# Patient Record
Sex: Female | Born: 1970 | Race: Black or African American | Hispanic: No | Marital: Married | Smoking: Never smoker
Health system: Southern US, Community
[De-identification: ages and names within clinical notes are randomized; demographics above are authoritative.]

## PROBLEM LIST (undated history)

## (undated) DIAGNOSIS — I10 Essential (primary) hypertension: Secondary | ICD-10-CM

## (undated) DIAGNOSIS — M1990 Unspecified osteoarthritis, unspecified site: Secondary | ICD-10-CM

## (undated) HISTORY — DX: Unspecified osteoarthritis, unspecified site: M19.90

## (undated) HISTORY — DX: Essential (primary) hypertension: I10

---

## 1997-12-21 ENCOUNTER — Other Ambulatory Visit: Admission: RE | Admit: 1997-12-21 | Discharge: 1997-12-21 | Payer: Self-pay | Admitting: Obstetrics

## 1998-09-22 ENCOUNTER — Encounter: Payer: Self-pay | Admitting: *Deleted

## 1998-09-22 ENCOUNTER — Ambulatory Visit (HOSPITAL_COMMUNITY): Admission: RE | Admit: 1998-09-22 | Discharge: 1998-09-22 | Payer: Self-pay | Admitting: *Deleted

## 1999-02-13 ENCOUNTER — Other Ambulatory Visit: Admission: RE | Admit: 1999-02-13 | Discharge: 1999-02-13 | Payer: Self-pay | Admitting: Obstetrics

## 1999-04-25 ENCOUNTER — Inpatient Hospital Stay (HOSPITAL_COMMUNITY): Admission: AD | Admit: 1999-04-25 | Discharge: 1999-04-25 | Payer: Self-pay | Admitting: Obstetrics

## 1999-05-01 ENCOUNTER — Inpatient Hospital Stay (HOSPITAL_COMMUNITY): Admission: AD | Admit: 1999-05-01 | Discharge: 1999-05-01 | Payer: Self-pay | Admitting: Obstetrics

## 1999-06-11 ENCOUNTER — Inpatient Hospital Stay (HOSPITAL_COMMUNITY): Admission: AD | Admit: 1999-06-11 | Discharge: 1999-06-11 | Payer: Self-pay | Admitting: Obstetrics

## 1999-07-13 ENCOUNTER — Inpatient Hospital Stay (HOSPITAL_COMMUNITY): Admission: AD | Admit: 1999-07-13 | Discharge: 1999-07-13 | Payer: Self-pay | Admitting: Obstetrics

## 1999-08-01 ENCOUNTER — Inpatient Hospital Stay (HOSPITAL_COMMUNITY): Admission: AD | Admit: 1999-08-01 | Discharge: 1999-08-01 | Payer: Self-pay | Admitting: Obstetrics

## 1999-08-02 ENCOUNTER — Inpatient Hospital Stay (HOSPITAL_COMMUNITY): Admission: AD | Admit: 1999-08-02 | Discharge: 1999-08-05 | Payer: Self-pay | Admitting: Obstetrics

## 2002-01-08 HISTORY — PX: KNEE SURGERY: SHX244

## 2002-06-15 ENCOUNTER — Encounter: Payer: Self-pay | Admitting: Family Medicine

## 2002-06-15 ENCOUNTER — Encounter: Admission: RE | Admit: 2002-06-15 | Discharge: 2002-06-15 | Payer: Self-pay | Admitting: Family Medicine

## 2002-07-28 ENCOUNTER — Emergency Department (HOSPITAL_COMMUNITY): Admission: EM | Admit: 2002-07-28 | Discharge: 2002-07-28 | Payer: Self-pay | Admitting: Emergency Medicine

## 2002-08-02 ENCOUNTER — Ambulatory Visit (HOSPITAL_COMMUNITY): Admission: RE | Admit: 2002-08-02 | Discharge: 2002-08-02 | Payer: Self-pay | Admitting: Family Medicine

## 2002-08-02 ENCOUNTER — Encounter: Payer: Self-pay | Admitting: Family Medicine

## 2002-10-08 ENCOUNTER — Ambulatory Visit (HOSPITAL_BASED_OUTPATIENT_CLINIC_OR_DEPARTMENT_OTHER): Admission: RE | Admit: 2002-10-08 | Discharge: 2002-10-08 | Payer: Self-pay | Admitting: Orthopedic Surgery

## 2002-10-16 ENCOUNTER — Encounter: Admission: RE | Admit: 2002-10-16 | Discharge: 2002-11-19 | Payer: Self-pay | Admitting: Orthopedic Surgery

## 2006-02-15 ENCOUNTER — Emergency Department (HOSPITAL_COMMUNITY): Admission: EM | Admit: 2006-02-15 | Discharge: 2006-02-15 | Payer: Self-pay | Admitting: Family Medicine

## 2006-10-24 ENCOUNTER — Ambulatory Visit (HOSPITAL_COMMUNITY): Admission: RE | Admit: 2006-10-24 | Discharge: 2006-10-24 | Payer: Self-pay | Admitting: Obstetrics

## 2007-10-27 ENCOUNTER — Ambulatory Visit (HOSPITAL_COMMUNITY): Admission: RE | Admit: 2007-10-27 | Discharge: 2007-10-27 | Payer: Self-pay | Admitting: Family Medicine

## 2008-09-14 ENCOUNTER — Observation Stay (HOSPITAL_COMMUNITY): Admission: EM | Admit: 2008-09-14 | Discharge: 2008-09-14 | Payer: Self-pay | Admitting: Emergency Medicine

## 2008-09-14 ENCOUNTER — Ambulatory Visit: Payer: Self-pay | Admitting: Cardiovascular Disease

## 2008-09-15 ENCOUNTER — Encounter (INDEPENDENT_AMBULATORY_CARE_PROVIDER_SITE_OTHER): Payer: Self-pay | Admitting: Emergency Medicine

## 2009-03-29 ENCOUNTER — Ambulatory Visit (HOSPITAL_COMMUNITY): Admission: RE | Admit: 2009-03-29 | Discharge: 2009-03-29 | Payer: Self-pay | Admitting: Family Medicine

## 2009-03-29 ENCOUNTER — Other Ambulatory Visit: Admission: RE | Admit: 2009-03-29 | Discharge: 2009-03-29 | Payer: Self-pay | Admitting: Obstetrics and Gynecology

## 2009-04-06 ENCOUNTER — Encounter: Admission: RE | Admit: 2009-04-06 | Discharge: 2009-04-06 | Payer: Self-pay | Admitting: Family Medicine

## 2010-01-29 ENCOUNTER — Encounter: Payer: Self-pay | Admitting: Family Medicine

## 2010-04-04 ENCOUNTER — Other Ambulatory Visit: Payer: Self-pay | Admitting: Obstetrics and Gynecology

## 2010-04-04 ENCOUNTER — Other Ambulatory Visit (HOSPITAL_COMMUNITY)
Admission: RE | Admit: 2010-04-04 | Discharge: 2010-04-04 | Disposition: A | Discharge status: Home / Self Care | Payer: BC Managed Care – PPO | Source: Ambulatory Visit | Attending: Obstetrics and Gynecology | Admitting: Obstetrics and Gynecology

## 2010-04-04 DIAGNOSIS — Z01419 Encounter for gynecological examination (general) (routine) without abnormal findings: Secondary | ICD-10-CM | POA: Insufficient documentation

## 2010-04-14 LAB — CK TOTAL AND CKMB (NOT AT ARMC)
CK, MB: 1.7 ng/mL (ref 0.3–4.0)
CK, MB: 1.9 ng/mL (ref 0.3–4.0)
Total CK: 172 U/L (ref 7–177)

## 2010-04-14 LAB — CBC
Hemoglobin: 12.9 g/dL (ref 12.0–15.0)
MCHC: 33.9 g/dL (ref 30.0–36.0)
RBC: 4.33 MIL/uL (ref 3.87–5.11)
WBC: 4.4 10*3/uL (ref 4.0–10.5)

## 2010-04-14 LAB — DIFFERENTIAL
Basophils Relative: 1 % (ref 0–1)
Eosinophils Absolute: 0.1 10*3/uL (ref 0.0–0.7)
Eosinophils Relative: 3 % (ref 0–5)
Lymphocytes Relative: 33 % (ref 12–46)
Lymphs Abs: 1.4 10*3/uL (ref 0.7–4.0)
Monocytes Absolute: 0.6 10*3/uL (ref 0.1–1.0)
Monocytes Relative: 13 % — ABNORMAL HIGH (ref 3–12)
Neutro Abs: 2.2 10*3/uL (ref 1.7–7.7)
Neutrophils Relative %: 50 % (ref 43–77)

## 2010-04-14 LAB — BASIC METABOLIC PANEL
CO2: 24 mEq/L (ref 19–32)
Glucose, Bld: 94 mg/dL (ref 70–99)
Potassium: 4 mEq/L (ref 3.5–5.1)

## 2010-04-14 LAB — POCT CARDIAC MARKERS
CKMB, poc: <1 ng/mL — ABNORMAL LOW (ref 1.0–8.0)
Troponin i, poc: <0.05 ng/mL (ref 0.00–0.09)

## 2010-04-14 LAB — PROTIME-INR
INR: 1 (ref 0.00–1.49)
Prothrombin Time: 12.6 seconds (ref 11.6–15.2)

## 2010-04-14 LAB — GLUCOSE, CAPILLARY: Glucose-Capillary: 97 mg/dL (ref 70–99)

## 2010-04-14 LAB — TROPONIN I: Troponin I: 0.01 ng/mL (ref 0.00–0.06)

## 2010-05-26 NOTE — Op Note (Signed)
Lisa Small, Lisa Small                      ACCOUNT NO.:  [id number]   MEDICAL RECORD NO.:  [id number]                   PATIENT TYPE:  AMB   LOCATION:  DSC                                  FACILITY:  [facility]   PHYSICIAN:  Rodney A. Chaney Malling, M.D.           DATE OF BIRTH:  1970-07-30   DATE OF PROCEDURE:  10/08/2002  DATE OF DISCHARGE:                                 OPERATIVE REPORT   PREOPERATIVE DIAGNOSES:  Unstable osteochondral fracture, medial femoral  condyle and osteoarthritis medial femoral condyle, right knee.   POSTOPERATIVE DIAGNOSES:  Unstable osteochondral fracture, medial femoral  condyle and osteoarthritis medial femoral condyle, right knee.   OPERATION PERFORMED:  Arthroscopy, removal of unstable osteochondral  fragment from the medial femoral condyle, debridement medial femoral condyle  crater and multiple drilling of crater.   SURGEON:  Lenard Galloway. Chaney Malling, M.D.   ANESTHESIA:  MAC.   PATHOLOGY:  With the arthroscope in the knee, a very careful examination of  the right knee was undertaken.  The patellofemoral junction appeared fairly  normal as did the articular cartilage.  In the lateral compartment, there  was normal articular cartilage over the lateral femoral condyle and lateral  tibial plateau.  The entire circumference of the lateral meniscus was  normal.  Anterior cruciate ligament was normal.  There was a small loose  body adjacent to the anterior cruciate ligament.  In the medial compartment,  the medial meniscus was fairly normal as was the articular cartilage over  the medial tibial plateau.  There was a huge area on the medial side of the  weightbearing area in the medial femoral condyle where there was a large,  loose osteochondral fragment and fraying and tearing of cartilage around  this with a large crater.  This loose fragment would be teased loose from  the crater with a probe and was held on by a tether.   DESCRIPTION OF PROCEDURE:  The  patient was placed on the operating table in  the supine position with a pneumatic tourniquet on the right upper thigh.  The right leg was placed in a leg holder and the right lower extremity was  prepped with DuraPrep and draped out in the usual manner.  Marcaine was  placed in the knee and Xylocaine and epinephrine used to infiltrate the  puncture wounds.  Then an infusion cannula was placed in the superior medial  pouch and the knee distended with saline.  Anteromedial and anterolateral  portals were made and the arthroscope was introduced.  The findings were as  described above.  There was a small loose body just adjacent to the anterior  cruciate ligament and this was easily removed.  The arthroscope was passed  deeper into the medial compartment.  There was a large tethered unstable  osteochondral fragment.  This was teased loose with a probed, grabbed with  pituitary and removed as a single large entity.  The intra-articular shaver  was then introduced and there was a very large crater in the weightbearing  area which was debrided.  There was an underlying raw bone with no articular  cartilage on the bone.  It was elected to do multiple drillings of the base  of the crater using a fracture pick technique.  Multiple holes were drilled.  The infusion cannula was stopped and there was good bleeding from the holes.  Further clean up was then done with the intra-articular shaver.  No other  significant pathology was seen in the knee.  The  knee was then filled with Marcaine and a large bulky compressive dressing  was applied.  Technically this procedure went extremely well.   FOLLOW UP:  See me in the office on Wednesday.                                                  Rodney A. Chaney Malling, M.D.    RAM/MEDQ  D:  10/08/2002  T:  10/08/2002  Job:  [id number]   cc:   Molly Maduro A. Nicholos Johns, M.D.  [address]  [place]  [place] [address]  Fax: [phone]

## 2010-05-26 NOTE — Discharge Summary (Signed)
[facility]  Patient:    Lisa Small, Lisa Small                   MRN: [id number] Adm. Date:  [id number] Disc. Date: [id number] Attending:  Venita Sheffield                           Discharge Summary  HISTORY OF PRESENT ILLNESS:   The patient is a 40 year old gravida 3, para 0-0-2-0 with EDC of August 21, 1999. She was admitted with ruptured membranes and the patient was in good labor all day.  HOSPITAL COURSE:              The patient underwent a primary low transverse cesarean section with delivery of a 6 pound 14 ounce female with Apgars of 9 and 9 from the OP position and nuchal cord.  She underwent the cesarean section because of failure to progress in labor.  Her hemoglobin was 13 on admission and postoperatively 10.8.  She did well and was discharged home on the third postoperative day ambulatory and on a regular diet.  DISCHARGE MEDICATIONS:        Tylenol No. 3 one q.4h. p.r.n.  FOLLOW-UP:                    She is to see me in six weeks.  DISCHARGE DIAGNOSIS:          Status post primary low transverse cesarean section at term for cephalopelvic disproportion. DD:  08/24/99 TD:  08/25/99 Job: [id number]

## 2010-05-26 NOTE — Op Note (Signed)
[facility]  Patient:    Lisa Small, Lisa Small                   MRN: [id number] Proc. Date: 08/03/99 Adm. Date:  [id number] Disc. Date: [id number] Attending:  Venita Sheffield                           Operative Report  PREOPERATIVE DIAGNOSIS:       Failure to progress in labor.  POSTOPERATIVE DIAGNOSIS:      Failure to progress in labor.  OPERATION:  SURGEON:                      Kathreen Cosier, M.D.  ANESTHESIA:                   Epidural.  DESCRIPTION OF PROCEDURE:     The patient was placed on the operating table in the supine position.  The abdomen was prepped and draped.  The bladder emptied with a Foley catheter.  A transverse suprapubic incision made and carried down to the rectus fascia.  The fascia ________ of the incision.  The recti muscles were retracted laterally.  The peritoneum was incised longitudinally. A transverse incision was made in the visceroperitoneum above the bladder and the bladder mobilized inferiorly.  A transverse lower uterine incision was made and the patient delivered from the OP position of a female, Apgar 9 and 9, weighing 6 pounds and 14 ounces.  There is a nuchal cord which was loose.  The fluid was cleared and the team was attendance.  The placenta removed manually. The uterine cavity cleaned with dry laps.  The uterine incision closed in one layer including myometrium and endometrium with #1 chromic.  Hemostasis was satisfactory.  The bladder flap was reattached with 2-0 chromic.  The uterus _______.  The tubes and ovaries are normal.  The abdomen closed in layers, peritoneum with continuous 2-0 chromic, fascia continuous 2-0 Dexon, and the skin closed with subcuticular suture of 3-0 Monocryl.                                Blood loss was 400 cc.  The patient tolerated the procedure well and went to the recovery room in good condition. DD:  08/03/99 TD:  08/05/99 Job: [id number]

## 2010-05-26 NOTE — H&P (Signed)
[facility]  Patient:    Lisa Small, Lisa Small                   MRN: [id number] Adm. Date:  [id number] Disc. Date: [id number] Attending:  Venita Sheffield                         History and Physical  HISTORY OF PRESENT ILLNESS:   The patient is a 40 year old gravida 3 para 0-0-2-0, [facility] August 21, 1999, who is admitted on July 25 with premature rupture of membranes at 5:30 a.m. today.  The fluid was clear.  She was contracting every five minutes.  The cervix was 3 cm, 80%, with the vertex at -2 station.  Her group B strep was negative.  The patient remained in labor throughout the day and at 6:50 p.m. was started on Pitocin stimulation, and by 11 p.m. the cervix was still 4 cm with the vertex -2 to -3 and molded.  There was no change in her cervix from admission at 8:50 a.m.  It was decided she would be delivered by C section for failure to progress.  PHYSICAL EXAMINATION:  GENERAL:                      Revealed a well-developed female in labor.  HEENT:                        Negative.  LUNGS:                        Clear.  HEART:                        Regular rhythm, no murmurs, no gallops.  ABDOMEN:                      Term-sized uterus with an estimated fetal weight at 7 pounds.  BREASTS:                      No masses.  EXTREMITIES:                  Negative. DD:  08/03/99 TD:  08/03/99 Job: [id number]

## 2010-10-05 ENCOUNTER — Other Ambulatory Visit: Payer: Self-pay | Admitting: Family Medicine

## 2010-10-05 DIAGNOSIS — Z1231 Encounter for screening mammogram for malignant neoplasm of breast: Secondary | ICD-10-CM

## 2010-10-23 ENCOUNTER — Ambulatory Visit: Payer: BC Managed Care – PPO

## 2011-10-31 ENCOUNTER — Other Ambulatory Visit (HOSPITAL_COMMUNITY): Payer: Self-pay | Admitting: Internal Medicine

## 2011-10-31 DIAGNOSIS — Z1231 Encounter for screening mammogram for malignant neoplasm of breast: Secondary | ICD-10-CM

## 2011-11-20 ENCOUNTER — Ambulatory Visit (HOSPITAL_COMMUNITY)
Admission: RE | Admit: 2011-11-20 | Discharge: 2011-11-20 | Disposition: A | Discharge status: Home / Self Care | Payer: No Typology Code available for payment source | Source: Ambulatory Visit | Attending: Internal Medicine | Admitting: Internal Medicine

## 2011-11-20 DIAGNOSIS — Z1231 Encounter for screening mammogram for malignant neoplasm of breast: Secondary | ICD-10-CM | POA: Insufficient documentation

## 2012-09-10 ENCOUNTER — Ambulatory Visit (INDEPENDENT_AMBULATORY_CARE_PROVIDER_SITE_OTHER): Payer: BC Managed Care – PPO | Admitting: Family Medicine

## 2012-09-10 VITALS — BP 142/80 | HR 74 | Temp 98.2°F | Resp 20 | Ht 63.0 in | Wt 242.4 lb

## 2012-09-10 DIAGNOSIS — M1711 Unilateral primary osteoarthritis, right knee: Secondary | ICD-10-CM

## 2012-09-10 DIAGNOSIS — M1710 Unilateral primary osteoarthritis, unspecified knee: Secondary | ICD-10-CM

## 2012-09-10 DIAGNOSIS — I10 Essential (primary) hypertension: Secondary | ICD-10-CM

## 2012-09-10 DIAGNOSIS — IMO0002 Reserved for concepts with insufficient information to code with codable children: Secondary | ICD-10-CM

## 2012-09-10 MED ORDER — NABUMETONE 750 MG PO TABS: 750.0000 mg | ORAL_TABLET | Freq: Two times a day (BID) | ORAL | Status: DC | PRN

## 2012-09-10 MED ORDER — HYDROCODONE-ACETAMINOPHEN 5-325 MG PO TABS: 1.0000 | ORAL_TABLET | Freq: Three times a day (TID) | ORAL | Status: DC | PRN

## 2012-09-10 NOTE — Progress Notes (Signed)
Subjective:    Patient ID: Lisa Small, female    DOB: February 05, 1970, 42 y.o.   MRN: [id number] Chief Complaint  Patient presents with  . Knee Pain    Rt Knee X 2 WEEKS - NKI-    HPI  Ms. Brooke Dare does have chronic knee problems for many years. Was diagnosed with early OA 10 yrs ago at the age of 27! Has been seen at [facility] ortho for knee prob in the past but currently won't see her since she owes them $$.  Has been mainly told that she just needs to loose weight which she has been working very hard to try to do - did loose about 50 lbs last yr but then gained some back after her mother passed away. For the past 2 wks, has been having worsening pain in back of knee, know to the point where it is hard for her to walk and drive, is limping. Pain is worst when sitting down as feels more tight and swollen, hurts to bend and can't straighten all the way, difficult to tell about swelling as knees large but definitely looks bigger to her than her left knee, having pain that radiates down posterior calf - at times all the way to inside of right heal with point tenderness on her heal, pain also radiates up her thigh - most pain immed above and below popliteal fossa/post knee.  Has had xrays in the past few yrs and always told that she has bad arthritis.  Has tried aleve and ibuprofen 800mg  at times w/o any relief. Tried icing knee yest w/o relief. At end of appt divulges that she is here for a second opinion. Actually saw another dr last wk who diagnosed her with a knee sprain but she did not seem confident in diagnosis and pt doesn't think she sprained it.  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 Was also given a rx for oral prednisone but she did not fill due to side effects - worried that the weight gain with this would in the long-run make her knee pain worse.  Has never wanted to consider knee  injections in the past as she is terrified of needles. However, is so desperate for sx relief, that after detailed discussion as to option, risk, benefits, etc - she decided to proceed with cortisone inj today. Past Medical History  Diagnosis Date  . Hypertension    No current outpatient prescriptions on file prior to visit.   No current facility-administered medications on file prior to visit.   No Known Allergies  Past Surgical History  Procedure Laterality Date  . Cesarean section    . Knee surgery  2004    Review of Systems  Constitutional: Positive for activity change. Negative for fever, chills and unexpected weight change.  Musculoskeletal: Positive for myalgias, joint swelling, arthralgias and gait problem. Negative for back pain.  Skin: Negative for color change and rash.  Neurological: Negative for weakness and numbness.      BP 142/80  Pulse 74  Temp(Src) 98.2 F (36.8 C) (Oral)  Resp 20  Ht 5\' 3"  (1.6 m)  Wt 242 lb 6.4 oz (109.952 kg)  BMI 42.95 kg/m2  SpO2 100% Objective:   Physical Exam  Constitutional: She is oriented to person, place, and time. She appears well-developed and well-nourished. No distress.  HENT:  Head: Normocephalic and atraumatic.  Right Ear: External ear normal.  Eyes: Conjunctivae are normal. No scleral icterus.  Pulmonary/Chest: Effort normal.  Musculoskeletal:       Right knee: She exhibits decreased range of motion, swelling, effusion and abnormal meniscus. She exhibits no ecchymosis, no deformity, no laceration, no erythema, normal alignment, no LCL laxity, normal patellar mobility, no bony tenderness and no MCL laxity. No medial joint line, no lateral joint line, no MCL, no LCL and no patellar tendon tenderness noted.  Tenderness over baker's cyst in popliteal fossa Pain with McMurrays and anterior drawer testing.  Neurological: She is alert and oriented to person, place, and time.  Skin: Skin is warm and dry. She is not  diaphoretic. No erythema.  Psychiatric: She has a normal mood and affect. Her behavior is normal.   Risks/benefits of aspiration and injection reviewed and informed consent obtained.  Right Knee positioned at 10 deg flexion, cleaned with betadine x 2.  Anesthesia w/ ethyl chloride cold spray. Attempted aspiration by superior lateral approach to suprapatellar bursa but no fluid was returned so injected with 40mg  of Kenalog and 5cc of 1% lidocaine using 18g 1 1/2in needle without complications. Needle was introduced smoothly with minimal discomfort to pt and medicine injected very easily without any resistance at all. Pt tolerated procedure beautifully. EBL <1cc.    Assessment & Plan:  Osteoarthritis of right knee xray and mri in epic from 2004 - 10 yrs prev - reviewed confirming OA and baker's cyst in 2004. Did not repeat xray at this time due to known OA which per pt has been since confirmed on mult repeated xrays.  Offered conservative management with RICE, nsaids, pain medication but pt feels like she has already tried that w/o relief so proceeded with initial cortisone inj today. I think this is a flair of her OA and hopefully she will respond to the cortisone inj. Start wearing brace while driving, ice freq. D/c meloxicam and try nabumetone intead.  Few vicodin given in case of severe pain at night but pt will try not to take due to her concerns about interfering with work/driving. Meds ordered this encounter  Medications  . hydrochlorothiazide (HYDRODIURIL) 25 MG tablet    Sig: Take 25 mg by mouth daily.  Marland Kitchen amLODipine (NORVASC) 10 MG tablet    Sig: Take 10 mg by mouth daily.  . valACYclovir (VALTREX) 500 MG tablet    Sig: Take 500 mg by mouth 2 (two) times daily.  . nabumetone (RELAFEN) 750 MG tablet    Sig: Take 1 tablet (750 mg total) by mouth 2 (two) times daily as needed for pain.    Dispense:  60 tablet    Refill:  1  . HYDROcodone-acetaminophen (NORCO/VICODIN) 5-325 MG per tablet     Sig: Take 1 tablet by mouth every 8 (eight) hours as needed for pain.    Dispense:  20 tablet    Refill:  0

## 2012-09-10 NOTE — Patient Instructions (Addendum)
Try the nambumetone instead of the meloxicam - it might be more effective for you. Do not take the hydrocodone before you drive - for severe pain at night only.  Ice as frequently as your can 20 minutes every 2 hours while not at work or sleeping.  Osteoarthritis Osteoarthritis is the most common form of arthritis. It is redness, soreness, and swelling (inflammation) affecting the cartilage. Cartilage acts as a cushion, covering the ends of bones where they meet to form a joint. CAUSES  Over time, the cartilage begins to wear away. This causes bone to rub on bone. This produces pain and stiffness in the affected joints. Factors that contribute to this problem are:  Excessive body weight.  Age.  Overuse of joints. SYMPTOMS   People with osteoarthritis usually experience joint pain, swelling, or stiffness.  Over time, the joint may lose its normal shape.  Small deposits of bone (osteophytes) may grow on the edges of the joint.  Bits of bone or cartilage can break off and float inside the joint space. This may cause more pain and damage.  Osteoarthritis can lead to depression, anxiety, feelings of helplessness, and limitations on daily activities. The most commonly affected joints are in the:  Ends of the fingers.  Thumbs.  Neck.  Lower back.  Knees.  Hips. DIAGNOSIS  Diagnosis is mostly based on your symptoms and exam. Tests may be helpful, including:  X-rays of the affected joint.  A computerized magnetic scan (MRI).  Blood tests to rule out other types of arthritis.  Joint fluid tests. This involves using a needle to draw fluid from the joint and examining the fluid under a microscope. TREATMENT  Goals of treatment are to control pain, improve joint function, maintain a normal body weight, and maintain a healthy lifestyle. Treatment approaches may include:  A prescribed exercise program with rest and joint relief.  Weight control with nutritional education.  Pain  relief techniques such as:  Properly applied heat and cold.  Electric pulses delivered to nerve endings under the skin (transcutaneous electrical nerve stimulation, TENS).  Massage.  Certain supplements. Ask your caregiver before using any supplements, especially in combination with prescribed drugs.  Medicines to control pain, such as:  Acetaminophen.  Nonsteroidal anti-inflammatory drugs (NSAIDs), such as naproxen.  Narcotic or central-acting agents, such as tramadol. This drug carries a risk of addiction and is generally prescribed for short-term use.  Corticosteroids. These can be given orally or as injection. This is a short-term treatment, not recommended for routine use.  Surgery to reposition the bones and relieve pain (osteotomy) or to remove loose pieces of bone and cartilage. Joint replacement may be needed in advanced states of osteoarthritis. HOME CARE INSTRUCTIONS  Your caregiver can recommend specific types of exercise. These may include:  Strengthening exercises. These are done to strengthen the muscles that support joints affected by arthritis. They can be performed with weights or with exercise bands to add resistance.  Aerobic activities. These are exercises, such as brisk walking or low-impact aerobics, that get your heart pumping. They can help keep your lungs and circulatory system in shape.  Range-of-motion activities. These keep your joints limber.  Balance and agility exercises. These help you maintain daily living skills. Learning about your condition and being actively involved in your care will help improve the course of your osteoarthritis. SEEK MEDICAL CARE IF:   You feel hot or your skin turns red.  You develop a rash in addition to your joint pain.  You have an oral temperature above 102 F (38.9 C). FOR MORE INFORMATION  National Institute of Arthritis and Musculoskeletal and Skin Diseases: www.niams.http://www.myers.net/ [employer] on Aging:  https://walker.com/ American College of Rheumatology: www.rheumatology.org Document Released: 12/25/2004 Document Revised: 03/19/2011 Document Reviewed: 04/07/2009 [facility] Patient Information 2014 [place], [place].

## 2012-09-11 DIAGNOSIS — I10 Essential (primary) hypertension: Secondary | ICD-10-CM | POA: Insufficient documentation

## 2012-09-11 DIAGNOSIS — M1711 Unilateral primary osteoarthritis, right knee: Secondary | ICD-10-CM | POA: Insufficient documentation

## 2012-10-09 ENCOUNTER — Ambulatory Visit: Payer: BC Managed Care – PPO | Admitting: Family Medicine

## 2012-10-09 ENCOUNTER — Ambulatory Visit
Admission: RE | Admit: 2012-10-09 | Discharge: 2012-10-09 | Disposition: A | Discharge status: Home / Self Care | Payer: BC Managed Care – PPO | Source: Ambulatory Visit | Attending: Family Medicine | Admitting: Family Medicine

## 2012-10-09 VITALS — BP 130/62 | HR 82 | Temp 98.4°F | Resp 16 | Ht 64.0 in | Wt 243.8 lb

## 2012-10-09 DIAGNOSIS — N938 Other specified abnormal uterine and vaginal bleeding: Secondary | ICD-10-CM

## 2012-10-09 DIAGNOSIS — Z01419 Encounter for gynecological examination (general) (routine) without abnormal findings: Secondary | ICD-10-CM

## 2012-10-09 DIAGNOSIS — N949 Unspecified condition associated with female genital organs and menstrual cycle: Secondary | ICD-10-CM

## 2012-10-09 DIAGNOSIS — I10 Essential (primary) hypertension: Secondary | ICD-10-CM

## 2012-10-09 DIAGNOSIS — R109 Unspecified abdominal pain: Secondary | ICD-10-CM

## 2012-10-09 DIAGNOSIS — N925 Other specified irregular menstruation: Secondary | ICD-10-CM

## 2012-10-09 LAB — COMPREHENSIVE METABOLIC PANEL
ALT: 16 U/L (ref 0–35)
AST: 20 U/L (ref 0–37)
Albumin: 4 g/dL (ref 3.5–5.2)
BUN: 9 mg/dL (ref 6–23)
Calcium: 9.7 mg/dL (ref 8.4–10.5)
Creat: 0.91 mg/dL (ref 0.50–1.10)
Potassium: 3.9 mEq/L (ref 3.5–5.3)
Sodium: 141 mEq/L (ref 135–145)

## 2012-10-09 LAB — POCT CBC
Granulocyte percent: 65.5 %G (ref 37–80)
HCT, POC: 43.4 % (ref 37.7–47.9)
Lymph, poc: 1.9 (ref 0.6–3.4)
MCH, POC: 29.9 pg (ref 27–31.2)
MCV: 94.7 fL (ref 80–97)
MPV: 8.1 fL (ref 0–99.8)
Platelet Count, POC: 310 10*3/uL (ref 142–424)

## 2012-10-09 LAB — POCT URINALYSIS DIPSTICK

## 2012-10-09 LAB — POCT UA - MICROSCOPIC ONLY
Casts, Ur, LPF, POC: NEGATIVE
Mucus, UA: POSITIVE
Yeast, UA: NEGATIVE

## 2012-10-09 LAB — POCT WET PREP WITH KOH
Trichomonas, UA: NEGATIVE
Yeast Wet Prep HPF POC: NEGATIVE

## 2012-10-09 LAB — LIPASE: Lipase: 26 U/L (ref 0–75)

## 2012-10-09 LAB — POCT URINE PREGNANCY: Preg Test, Ur: NEGATIVE

## 2012-10-09 NOTE — Progress Notes (Signed)
Subjective:    Patient ID: Lisa Small, female    DOB: 04/04/1970, 42 y.o.   MRN: [id number] Chief Complaint  Patient presents with  . Fatigue    X yesterday, has had period for 3 weeks  . Dizziness    X this morning  . Abdominal Pain    X yesterday   HPI  Has been on her period x 3 wks now and yesterday began feeling weak and dizzy this morning.  Then started having stomach pains intermittently for the past 3 wks but this morning very severe.  Prior to this periods have been very regular every month - only last 4-5d.  For the past 3 wks, period has been on regular flow. Sister had fibroids but pt has never been diagnosed with any uterine/ovarian problems. Last pelvic was a while back.  Did have an abnormal pap smear yrs ago but was normal on repeat - over 10 yrs ago.  Has been over at [facility] for care prior to here.  No f/c.  Has BM 1-2x/d and tends toward constipation at baseline treated with high fiber diet and prn miralax.  Urine at baseline, drinks a lot of water.  No n/v. Normal appetite. Is sexually active and last STD screen was about 2 yrs ago.  Has benign tumor on her right thigh that used to feel mushy but is now feeling more firm.  Past Medical History  Diagnosis Date  . Hypertension   . Arthritis    Current Outpatient Prescriptions on File Prior to Visit  Medication Sig Dispense Refill  . hydrochlorothiazide (HYDRODIURIL) 25 MG tablet Take 25 mg by mouth daily.      . nabumetone (RELAFEN) 750 MG tablet Take 1 tablet (750 mg total) by mouth 2 (two) times daily as needed for pain.  60 tablet  1  . valACYclovir (VALTREX) 500 MG tablet Take 500 mg by mouth 2 (two) times daily.      Marland Kitchen amLODipine (NORVASC) 10 MG tablet Take 10 mg by mouth daily.      Marland Kitchen HYDROcodone-acetaminophen (NORCO/VICODIN) 5-325 MG per tablet Take 1 tablet by mouth every 8 (eight) hours as needed for pain.  20 tablet  0   No current facility-administered medications on file prior to visit.   No  Known Allergies    Review of Systems  Constitutional: Positive for fatigue. Negative for fever, chills, activity change and appetite change.  Gastrointestinal: Positive for abdominal pain and constipation. Negative for nausea, vomiting and diarrhea.  Genitourinary: Positive for vaginal bleeding, menstrual problem and pelvic pain. Negative for dysuria, urgency, frequency, hematuria, flank pain, decreased urine volume, vaginal discharge, difficulty urinating and vaginal pain.  Skin: Negative for color change, pallor, rash and wound.  Neurological: Positive for dizziness, weakness and light-headedness.      BP 130/62  Pulse 82  Temp(Src) 98.4 F (36.9 C) (Oral)  Resp 16  Ht 5\' 4"  (1.626 m)  Wt 243 lb 12.8 oz (110.587 kg)  BMI 41.83 kg/m2  SpO2 99%  LMP 09/15/2012 Objective:   Physical Exam  Constitutional: She is oriented to person, place, and time. She appears well-developed and well-nourished. No distress.  HENT:  Head: Normocephalic and atraumatic.  Cardiovascular: Normal rate, regular rhythm, normal heart sounds and intact distal pulses.   Pulmonary/Chest: Effort normal and breath sounds normal.  Abdominal: Soft. Bowel sounds are normal. She exhibits no distension. There is no tenderness. There is no rebound and no guarding.  Genitourinary: Uterus normal. Pelvic exam was performed  with patient supine. There is no rash, tenderness or lesion on the right labia. There is no rash, tenderness or lesion on the left labia. Cervix exhibits no motion tenderness and no friability. Right adnexum displays tenderness. Right adnexum displays no mass and no fullness. Left adnexum displays tenderness. Left adnexum displays no mass and no fullness. There is bleeding around the vagina. No erythema or tenderness around the vagina. Vaginal discharge found.  Lymphadenopathy:       Right: No inguinal adenopathy present.       Left: No inguinal adenopathy present.  Neurological: She is alert and oriented  to person, place, and time.  Skin: Skin is warm and dry. She is not diaphoretic.  Lipoma noted on upper right thigh.  Psychiatric: She has a normal mood and affect. Her behavior is normal.          Results for orders placed in visit on 10/09/12  POCT UA - MICROSCOPIC ONLY      Result Value Range   WBC, Ur, HPF, POC 2-4     RBC, urine, microscopic 4-10     Bacteria, U Microscopic trace     Mucus, UA positive     Epithelial cells, urine per micros 3-6     Crystals, Ur, HPF, POC neg     Casts, Ur, LPF, POC neg     Yeast, UA neg    POCT URINALYSIS DIPSTICK      Result Value Range   Color, UA yellow     Clarity, UA clear     Glucose, UA neg     Bilirubin, UA neg     Ketones, UA neg     Spec Grav, UA 1.015     Blood, UA small     pH, UA 6.5     Protein, UA neg     Urobilinogen, UA 0.2     Nitrite, UA neg     Leukocytes, UA Negative    POCT URINE PREGNANCY      Result Value Range   Preg Test, Ur Negative    POCT WET PREP WITH KOH      Result Value Range   Trichomonas, UA Negative     Clue Cells Wet Prep HPF POC neg     Epithelial Wet Prep HPF POC 2-3     Yeast Wet Prep HPF POC neg     Bacteria Wet Prep HPF POC trace     RBC Wet Prep HPF POC TNTC     WBC Wet Prep HPF POC 2-3     KOH Prep POC Negative    POCT CBC      Result Value Range   WBC 7.4  4.6 - 10.2 K/uL   Lymph, poc 1.9  0.6 - 3.4   POC LYMPH PERCENT 26.3  10 - 50 %L   MID (cbc) 0.6  0 - 0.9   POC MID % 8.2  0 - 12 %M   POC Granulocyte 4.8  2 - 6.9   Granulocyte percent 65.5  37 - 80 %G   RBC 4.58  4.04 - 5.48 M/uL   Hemoglobin 13.7  12.2 - 16.2 g/dL   HCT, POC 09.8  11.9 - 47.9 %   MCV 94.7  80 - 97 fL   MCH, POC 29.9  27 - 31.2 pg   MCHC 31.6 (*) 31.8 - 35.4 g/dL   RDW, POC 14.7     Platelet Count, POC 310  142 - 424 K/uL  MPV 8.1  0 - 99.8 fL    Assessment & Plan:  Essential hypertension, benign - Plan: TSH, Comprehensive metabolic panel, CANCELED: CBC with Differential  Abdominal pain,  unspecified site - Plan: POCT UA - Microscopic Only, POCT urinalysis dipstick, Lipase, Comprehensive metabolic panel, US Pelvis Complete, US Transvaginal Non-OB, POCT Wet Prep with KOH, POCT CBC, CANCELED: CBC with Differential  Dysfunctional uterine bleeding - Plan: POCT UA - Microscopic Only, POCT urinalysis dipstick, POCT urine pregnancy, US Pelvis Complete, US Transvaginal Non-OB, POCT Wet Prep with KOH, POCT CBC, CANCELED: CBC with Differential  Visit for gynecologic examination - Plan: Pap IG, CT/NG NAA, and HPV (high risk), US Transvaginal Non-OB  Send for tv pelvic US - then f/u after [place] in 2d to discuss results and plan.   Norberto Sorenson, MD MPH

## 2012-10-09 NOTE — Patient Instructions (Signed)
You can go for the ultrasound today at 1pm, at [facility] [address] [place] . Drink 32 oz of water one hour prior to the scan.

## 2012-10-11 ENCOUNTER — Ambulatory Visit: Payer: BC Managed Care – PPO | Admitting: Family Medicine

## 2012-10-11 VITALS — BP 126/82 | HR 77 | Temp 98.2°F | Resp 18 | Wt 245.0 lb

## 2012-10-11 DIAGNOSIS — D1720 Benign lipomatous neoplasm of skin and subcutaneous tissue of unspecified limb: Secondary | ICD-10-CM

## 2012-10-11 DIAGNOSIS — D1779 Benign lipomatous neoplasm of other sites: Secondary | ICD-10-CM

## 2012-10-11 DIAGNOSIS — N949 Unspecified condition associated with female genital organs and menstrual cycle: Secondary | ICD-10-CM

## 2012-10-11 DIAGNOSIS — N83209 Unspecified ovarian cyst, unspecified side: Secondary | ICD-10-CM

## 2012-10-11 DIAGNOSIS — N938 Other specified abnormal uterine and vaginal bleeding: Secondary | ICD-10-CM

## 2012-10-11 DIAGNOSIS — N925 Other specified irregular menstruation: Secondary | ICD-10-CM

## 2012-10-11 LAB — POCT CBC
Granulocyte percent: 62.5 %G (ref 37–80)
HCT, POC: 41.2 % (ref 37.7–47.9)
Lymph, poc: 1.8 (ref 0.6–3.4)
MCH, POC: 28.9 pg (ref 27–31.2)
MCHC: 30.8 g/dL — AB (ref 31.8–35.4)
MCV: 93.6 fL (ref 80–97)
MID (cbc): 0.5 (ref 0–0.9)
MPV: 7.8 fL (ref 0–99.8)
POC LYMPH PERCENT: 28.9 %L (ref 10–50)
POC MID %: 8.6 %M (ref 0–12)
Platelet Count, POC: 314 10*3/uL (ref 142–424)
WBC: 6.3 10*3/uL (ref 4.6–10.2)

## 2012-10-11 MED ORDER — MELOXICAM 15 MG PO TABS: 15.0000 mg | ORAL_TABLET | Freq: Every day | ORAL | Status: AC

## 2012-10-11 MED ORDER — METRONIDAZOLE 500 MG PO TABS: 500.0000 mg | ORAL_TABLET | Freq: Two times a day (BID) | ORAL | Status: DC

## 2012-10-11 NOTE — Progress Notes (Signed)
Subjective:    Patient ID: Lisa Small, female    DOB: Jun 30, 1970, 42 y.o.   MRN: [id number]  HPI HPI Comments: Lisa Small is a 42 y.o. female who presents to the [facility]  Following up on ovarian cyst. Pt is currently complaining of generalized weakness and fatigue. Pt report weakness worsens with ambulation. Pt states she has had an increase in her appetite. Pt describes pain as cramping and instantaneous in nature  Pt reports pain around lower abdomen. Pt denies any history of ovarian cyst. Pt reports heavy vaginal bleeding and strong odor since last ultrasound.  Pt tried douching to remove odor. Pt states she noticed a soft lump on upper right thigh.   Past Medical History  Diagnosis Date  . Hypertension   . Arthritis      Review of Systems  Constitutional: Positive for appetite change (increased appetite).  Gastrointestinal: Positive for abdominal pain (RLQ).  Genitourinary: Positive for vaginal bleeding (heavy vaginal bleeding). Negative for vaginal pain.  Neurological: Positive for weakness.   Current Outpatient Prescriptions on File Prior to Visit  Medication Sig Dispense Refill  . amLODipine (NORVASC) 10 MG tablet Take 10 mg by mouth daily.      . hydrochlorothiazide (HYDRODIURIL) 25 MG tablet Take 25 mg by mouth daily.      Marland Kitchen HYDROcodone-acetaminophen (NORCO/VICODIN) 5-325 MG per tablet Take 1 tablet by mouth every 8 (eight) hours as needed for pain.  20 tablet  0  . nabumetone (RELAFEN) 750 MG tablet Take 1 tablet (750 mg total) by mouth 2 (two) times daily as needed for pain.  60 tablet  1  . valACYclovir (VALTREX) 500 MG tablet Take 500 mg by mouth 2 (two) times daily.       No current facility-administered medications on file prior to visit.     No Known Allergies      BP 126/82  Pulse 77  Temp(Src) 98.2 F (36.8 C) (Oral)  Resp 18  Wt 245 lb (111.131 kg)  BMI 42.03 kg/m2  LMP 09/15/2012 Objective:   Physical Exam  Nursing note and vitals  reviewed. Constitutional: She is oriented to person, place, and time. She appears well-developed and well-nourished. No distress.  HENT:  Head: Normocephalic and atraumatic.  Eyes: EOM are normal.  Neck: Normal range of motion.  Cardiovascular: Normal rate, regular rhythm and normal heart sounds.   Pulmonary/Chest: Effort normal and breath sounds normal.  Abdominal: Soft. She exhibits no distension. There is tenderness in the right lower quadrant. There is no CVA tenderness.  RLQ pain.   Musculoskeletal: Normal range of motion.  Well defined soft 4cm mass upper right thigh.  Neurological: She is alert and oriented to person, place, and time.  Skin: Skin is warm and dry.  Psychiatric: She has a normal mood and affect. Judgment normal.      Assessment & Plan:  Lipoma of lower extremity - benign, reassurance offered. If worse comes to worse and she has to undergo surgery for her cyst and needs general anesthesia - perhaps she could c/s with a general surgeon to see if this could be removed during the same procedure. . . .  Other and unspecified ovarian cyst - Plan: Ambulatory referral to Obstetrics / Gynecology - pt still having significant discomfort and bleeding and if she needs surgery wants to get it taken care of before her long awaited cruise so will refer to gyn. Was seen by [facility] gyn prev but she can't f/u with them currently due to  prev payments from prev ins.  Dysfunctional uterine bleeding - Plan: POCT CBC, Ambulatory referral to Obstetrics / Gynecology Will try presumptive treatment with flagyl and pt continues to c/o foul odor and did douch - unable to assess for BV due to copious vaginal blood so empirically treated.  Meds ordered this encounter  Medications  . metroNIDAZOLE (FLAGYL) 500 MG tablet    Sig: Take 1 tablet (500 mg total) by mouth 2 (two) times daily with a meal. DO NOT CONSUME ALCOHOL WHILE TAKING THIS MEDICATION.    Dispense:  14 tablet    Refill:  0

## 2012-10-13 ENCOUNTER — Encounter: Payer: Self-pay | Admitting: Family Medicine

## 2012-10-13 LAB — PAP IG, CT-NG NAA, HPV HIGH-RISK
Chlamydia Probe Amp: NEGATIVE
GC Probe Amp: NEGATIVE

## 2012-11-04 ENCOUNTER — Telehealth: Payer: Self-pay

## 2012-11-04 NOTE — Telephone Encounter (Signed)
Pharm reqs Rx for acyclovir 800 mg QD #30 w/RFs. Dr Clelia Croft, you have seen this pt recently but do not see that you Rxd or discussed this. Do you want to Rx?

## 2012-11-05 NOTE — Telephone Encounter (Signed)
Pt reported that she actually found a second bottle of her medication that she still has 5 RFs on so she does not need it currently. She did verify that she takes acyclovir 800 mg 1 tab QD for suppression.

## 2012-11-05 NOTE — Telephone Encounter (Signed)
I am happy to refill pt's antiviral but we have listed that pt was taking valtrex 500 bid.  Acyclovir is not a qd medication (which is why we rarely rx it) - if it is used as suppression is usually 400 bid - please clarify which w/ pt valtrex vs acyclovir, suppression (daily) vs recurrence (outbreak only)  therapy, and oral or genital HSV so we can get dosing correct.

## 2013-01-28 ENCOUNTER — Ambulatory Visit (INDEPENDENT_AMBULATORY_CARE_PROVIDER_SITE_OTHER): Payer: BC Managed Care – PPO | Admitting: Family Medicine

## 2013-01-28 VITALS — BP 132/82 | HR 105 | Temp 98.8°F | Resp 17 | Ht 63.5 in | Wt 253.0 lb

## 2013-01-28 DIAGNOSIS — E010 Iodine-deficiency related diffuse (endemic) goiter: Secondary | ICD-10-CM

## 2013-01-28 DIAGNOSIS — R51 Headache: Secondary | ICD-10-CM

## 2013-01-28 DIAGNOSIS — E049 Nontoxic goiter, unspecified: Secondary | ICD-10-CM

## 2013-01-28 LAB — POCT CBC
Granulocyte percent: 52 %G (ref 37–80)
HCT, POC: 38.6 % (ref 37.7–47.9)
Hemoglobin: 12.6 g/dL (ref 12.2–16.2)
Lymph, poc: 1.7 (ref 0.6–3.4)
MCH, POC: 30.7 pg (ref 27–31.2)
MCHC: 32.6 g/dL (ref 31.8–35.4)
MCV: 93.9 fL (ref 80–97)
MID (cbc): 0.5 (ref 0–0.9)
MPV: 8.1 fL (ref 0–99.8)
POC Granulocyte: 2.3 (ref 2–6.9)
POC LYMPH PERCENT: 37.4 %L (ref 10–50)
POC MID %: 10.6 %M (ref 0–12)
Platelet Count, POC: 343 10*3/uL (ref 142–424)
RBC: 4.11 M/uL (ref 4.04–5.48)
RDW, POC: 14 %
WBC: 4.5 10*3/uL — AB (ref 4.6–10.2)

## 2013-01-28 LAB — POCT SEDIMENTATION RATE: POCT SED RATE: 25 mm/hr — AB (ref 0–22)

## 2013-01-28 MED ORDER — CYCLOBENZAPRINE HCL 5 MG PO TABS: 5.0000 mg | ORAL_TABLET | Freq: Every day | ORAL | Status: DC

## 2013-01-28 NOTE — Progress Notes (Signed)
43yo woman with frontal headache which began after cleaning  Stove on Sunday.  No h/o migraines  No breathing problems.  She has tried aleve and ibuprofen.    The ibuprofen seemed to help.  No visual problems or nausea.    Patient has hypertension.  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  Objective:  NAD Eyes:  Normal fundi, EOM TM's:  Normal Oroph:  Clear Neck:  Moderate smooth enlargement of thyroid, supple, no adenopathy Chest:   Clear Heart:  Reg, no murmur Neuro:  Intact CN, motor, gait  Results for orders placed in visit on 10/11/12  POCT CBC      Result Value Range   WBC 6.3  4.6 - 10.2 K/uL   Lymph, poc 1.8  0.6 - 3.4   POC LYMPH PERCENT 28.9  10 - 50 %L   MID (cbc) 0.5  0 - 0.9   POC MID % 8.6  0 - 12 %M   POC Granulocyte 3.9  2 - 6.9   Granulocyte percent 62.5  37 - 80 %G   RBC 4.40  4.04 - 5.48 M/uL   Hemoglobin 12.7  12.2 - 16.2 g/dL   HCT, POC 41.2  37.7 - 47.9 %   MCV 93.6  80 - 97 fL   MCH, POC 28.9  27 - 31.2 pg   MCHC 30.8 (*) 31.8 - 35.4 g/dL   RDW, POC 15.6     Platelet Count, POC 314  142 - 424 K/uL   MPV 7.8  0 - 99.8 fL   Assessment: Thyromegaly - Plan: POCT CBC, POCT SEDIMENTATION RATE, TSH  Headache(784.0) - Plan: POCT CBC, POCT SEDIMENTATION RATE, TSH, cyclobenzaprine (FLEXERIL) 5 MG tablet  Signed, Robyn Haber, MD

## 2013-01-28 NOTE — Patient Instructions (Signed)

## 2013-01-29 LAB — TSH: TSH: 1.001 u[IU]/mL (ref 0.350–4.500)

## 2013-04-28 ENCOUNTER — Other Ambulatory Visit (HOSPITAL_COMMUNITY): Payer: Self-pay | Admitting: Internal Medicine

## 2013-04-28 DIAGNOSIS — Z1231 Encounter for screening mammogram for malignant neoplasm of breast: Secondary | ICD-10-CM

## 2013-05-11 ENCOUNTER — Ambulatory Visit (HOSPITAL_COMMUNITY): Payer: BC Managed Care – PPO | Attending: Internal Medicine

## 2013-05-21 ENCOUNTER — Ambulatory Visit (HOSPITAL_COMMUNITY)
Admission: RE | Admit: 2013-05-21 | Discharge: 2013-05-21 | Disposition: A | Discharge status: Home / Self Care | Payer: BC Managed Care – PPO | Source: Ambulatory Visit | Attending: Internal Medicine | Admitting: Internal Medicine

## 2013-05-21 DIAGNOSIS — Z1231 Encounter for screening mammogram for malignant neoplasm of breast: Secondary | ICD-10-CM | POA: Insufficient documentation

## 2014-03-26 ENCOUNTER — Encounter (HOSPITAL_COMMUNITY): Payer: Self-pay

## 2014-03-26 ENCOUNTER — Emergency Department (HOSPITAL_COMMUNITY): Payer: Self-pay

## 2014-03-26 ENCOUNTER — Emergency Department (HOSPITAL_COMMUNITY)
Admission: EM | Admit: 2014-03-26 | Discharge: 2014-03-26 | Disposition: A | Discharge status: Home / Self Care | Payer: Self-pay | Attending: Emergency Medicine | Admitting: Emergency Medicine

## 2014-03-26 DIAGNOSIS — M1990 Unspecified osteoarthritis, unspecified site: Secondary | ICD-10-CM | POA: Insufficient documentation

## 2014-03-26 DIAGNOSIS — Z79899 Other long term (current) drug therapy: Secondary | ICD-10-CM | POA: Insufficient documentation

## 2014-03-26 DIAGNOSIS — N8500 Endometrial hyperplasia, unspecified: Secondary | ICD-10-CM | POA: Insufficient documentation

## 2014-03-26 DIAGNOSIS — N938 Other specified abnormal uterine and vaginal bleeding: Secondary | ICD-10-CM | POA: Insufficient documentation

## 2014-03-26 DIAGNOSIS — N939 Abnormal uterine and vaginal bleeding, unspecified: Secondary | ICD-10-CM

## 2014-03-26 DIAGNOSIS — Z791 Long term (current) use of non-steroidal anti-inflammatories (NSAID): Secondary | ICD-10-CM | POA: Insufficient documentation

## 2014-03-26 DIAGNOSIS — E669 Obesity, unspecified: Secondary | ICD-10-CM | POA: Insufficient documentation

## 2014-03-26 DIAGNOSIS — Z3202 Encounter for pregnancy test, result negative: Secondary | ICD-10-CM | POA: Insufficient documentation

## 2014-03-26 DIAGNOSIS — I10 Essential (primary) hypertension: Secondary | ICD-10-CM | POA: Insufficient documentation

## 2014-03-26 DIAGNOSIS — R9389 Abnormal findings on diagnostic imaging of other specified body structures: Secondary | ICD-10-CM

## 2014-03-26 LAB — CBC WITH DIFFERENTIAL/PLATELET
Basophils Absolute: 0 10*3/uL (ref 0.0–0.1)
Basophils Relative: 1 % (ref 0–1)
EOS ABS: 0.1 10*3/uL (ref 0.0–0.7)
Eosinophils Relative: 2 % (ref 0–5)
HEMATOCRIT: 38.1 % (ref 36.0–46.0)
Hemoglobin: 12.3 g/dL (ref 12.0–15.0)
LYMPHS PCT: 37 % (ref 12–46)
Lymphs Abs: 1.6 10*3/uL (ref 0.7–4.0)
MCH: 28.1 pg (ref 26.0–34.0)
MCHC: 32.3 g/dL (ref 30.0–36.0)
MCV: 87.2 fL (ref 78.0–100.0)
MONO ABS: 0.4 10*3/uL (ref 0.1–1.0)
Monocytes Relative: 10 % (ref 3–12)
Neutro Abs: 2.1 10*3/uL (ref 1.7–7.7)
Neutrophils Relative %: 50 % (ref 43–77)
Platelets: 350 10*3/uL (ref 150–400)
RBC: 4.37 MIL/uL (ref 3.87–5.11)
RDW: 14.2 % (ref 11.5–15.5)
WBC: 4.2 10*3/uL (ref 4.0–10.5)

## 2014-03-26 LAB — WET PREP, GENITAL
CLUE CELLS WET PREP: NONE SEEN
TRICH WET PREP: NONE SEEN
Yeast Wet Prep HPF POC: NONE SEEN

## 2014-03-26 LAB — PREGNANCY, URINE: Preg Test, Ur: NEGATIVE

## 2014-03-26 NOTE — ED Notes (Signed)
Patient transported to Ultrasound 

## 2014-03-26 NOTE — ED Notes (Signed)
Per pt, bleeding x 2 weeks.  Hx of cyst on ovaries.  Felt it may be same.  Since yesterday, severe bleeding with uterine cramping.  Pt has OBGYN.  Has insurance complications and unable to see MD

## 2014-03-26 NOTE — Discharge Instructions (Signed)
Follow-up with your OB/GYN as soon as possible. You will need an endometrial biopsy.  Abnormal Uterine Bleeding Abnormal uterine bleeding can affect women at various stages in life, including teenagers, women in their reproductive years, pregnant women, and women who have reached menopause. Several kinds of uterine bleeding are considered abnormal, including:  Bleeding or spotting between periods.   Bleeding after sexual intercourse.   Bleeding that is heavier or more than normal.   Periods that last longer than usual.  Bleeding after menopause.  Many cases of abnormal uterine bleeding are minor and simple to treat, while others are more serious. Any type of abnormal bleeding should be evaluated by your health care provider. Treatment will depend on the cause of the bleeding. HOME CARE INSTRUCTIONS Monitor your condition for any changes. The following actions may help to alleviate any discomfort you are experiencing:  Avoid the use of tampons and douches as directed by your health care provider.  Change your pads frequently. You should get regular pelvic exams and Pap tests. Keep all follow-up appointments for diagnostic tests as directed by your health care provider.  SEEK MEDICAL CARE IF:   Your bleeding lasts more than 1 week.   You feel dizzy at times.  SEEK IMMEDIATE MEDICAL CARE IF:   You pass out.   You are changing pads every 15 to 30 minutes.   You have abdominal pain.  You have a fever.   You become sweaty or weak.   You are passing large blood clots from the vagina.   You start to feel nauseous and vomit. MAKE SURE YOU:   Understand these instructions.  Will watch your condition.  Will get help right away if you are not doing well or get worse. Document Released: 12/25/2004 Document Revised: 12/30/2012 Document Reviewed: 07/24/2012 [facility] Patient Information 2015 [place], [place]. This information is not intended to replace advice given to you  by your health care provider. Make sure you discuss any questions you have with your health care provider.

## 2014-03-26 NOTE — ED Notes (Signed)
Pt to be d/c after consult is complete

## 2014-03-26 NOTE — ED Provider Notes (Signed)
CSN: [id number]     Arrival date & time 03/26/14  1109 History   First MD Initiated Contact with Patient 03/26/14 1620     Chief Complaint  Patient presents with  . Vaginal Bleeding     (Consider location/radiation/quality/duration/timing/severity/associated sxs/prior Treatment) HPI Comments: 44 year old female presenting with 2 weeks of intermittent vaginal bleeding. States her menstrual cycle started last week with a small run of spotting, lasted a few days with blood only on the toilet tissue, however yesterday, the bleeding became more severe with large amounts of clots, states she has soaked through multiple pads. Admits to mild, intermittent left lower abdominal pain described as a dull ache. No pain at present. Reports a history of an ovarian cyst one year ago which went away on its own, which at that time she was having similar symptoms. She sees an OB/GYN at [facility], however due to insurance issues was unable to see her today. Slight nausea without vomiting. Denies fever, chills or urinary symptoms. Has a copper IUD.  Patient is a 44 y.o. female presenting with vaginal bleeding. The history is provided by the patient.  Vaginal Bleeding Associated symptoms: abdominal pain and nausea     Past Medical History  Diagnosis Date  . Hypertension   . Arthritis    Past Surgical History  Procedure Laterality Date  . Cesarean section    . Knee surgery  2004   Family History  Problem Relation Age of Onset  . Heart disease Mother   . Heart disease Maternal Grandfather   . Heart disease Paternal Grandmother    History  Substance Use Topics  . Smoking status: Never Smoker   . Smokeless tobacco: Not on file  . Alcohol Use: No   OB History    No data available     Review of Systems  Gastrointestinal: Positive for nausea and abdominal pain.  Genitourinary: Positive for vaginal bleeding.  All other systems reviewed and are negative.     Allergies  Review of patient's  allergies indicates no known allergies.  Home Medications   Prior to Admission medications   Medication Sig Start Date End Date Taking? Authorizing Provider  acyclovir (ZOVIRAX) 800 MG tablet Take 800 mg by mouth daily. 03/04/14  Yes Historical Provider, MD  amLODipine (NORVASC) 10 MG tablet Take 10 mg by mouth daily.   Yes Historical Provider, MD  hydrochlorothiazide (HYDRODIURIL) 25 MG tablet Take 25 mg by mouth daily.   Yes Historical Provider, MD  KLOR-CON M10 10 MEQ tablet Take 10 mEq by mouth daily. 02/23/14  Yes Historical Provider, MD  meloxicam (MOBIC) 15 MG tablet Take 1 tablet (15 mg total) by mouth daily. Patient taking differently: Take 15 mg by mouth daily as needed for pain.  10/11/12  Yes Shawnee Knapp, MD  phentermine (ADIPEX-P) 37.5 MG tablet Take 37.5 mg by mouth every morning. 03/13/14  Yes Historical Provider, MD  cyclobenzaprine (FLEXERIL) 5 MG tablet Take 1 tablet (5 mg total) by mouth at bedtime. Patient not taking: Reported on 03/26/2014 01/28/13   Kerline Trahan Haber, MD  valACYclovir (VALTREX) 500 MG tablet Take 500 mg by mouth 2 (two) times daily as needed (viral outbreak.).     Historical Provider, MD   BP 110/59 mmHg  Pulse 73  Temp(Src) 98.6 F (37 C) (Oral)  Resp 18  SpO2 99%  LMP 03/14/2014 Physical Exam  Constitutional: She is oriented to person, place, and time. She appears well-developed and well-nourished. No distress.  Obese.  HENT:  Head: Normocephalic and atraumatic.  Mouth/Throat: Oropharynx is clear and moist.  Eyes: Conjunctivae and EOM are normal.  Neck: Normal range of motion. Neck supple.  Cardiovascular: Normal rate, regular rhythm and normal heart sounds.   Pulmonary/Chest: Effort normal and breath sounds normal. No respiratory distress.  Abdominal: Soft. Bowel sounds are normal. She exhibits no distension and no mass. There is no rebound, no guarding and no tenderness at McBurney's point.  Mild right suprapubic tenderness. No peritoneal signs.   Genitourinary: Uterus normal. There is bleeding in the vagina. No erythema or tenderness in the vagina. No signs of injury around the vagina.  "Discomfort" bilateral adnexa. Exam limited by pt's body habitus.  Musculoskeletal: Normal range of motion. She exhibits no edema.  Neurological: She is alert and oriented to person, place, and time. No sensory deficit.  Skin: Skin is warm and dry.  Psychiatric: She has a normal mood and affect. Her behavior is normal.  Nursing note and vitals reviewed.   ED Course  Procedures (including critical care time) Labs Review Labs Reviewed  WET PREP, GENITAL  CBC WITH DIFFERENTIAL/PLATELET  PREGNANCY, URINE  GC/CHLAMYDIA PROBE AMP ([place])    Imaging Review US Transvaginal Non-ob  03/26/2014   CLINICAL DATA:  Vaginal bleeding since March 21, 2014  EXAM: TRANSABDOMINAL AND TRANSVAGINAL ULTRASOUND OF PELVIS  TECHNIQUE: Both transabdominal and transvaginal ultrasound examinations of the pelvis were performed. Transabdominal technique was performed for global imaging of the pelvis including uterus, ovaries, adnexal regions, and pelvic cul-de-sac. It was necessary to proceed with endovaginal exam following the transabdominal exam to visualize the endometrium.  COMPARISON:  Ultrasound 10/09/2012  FINDINGS: Uterus  Measurements: Normal in size at 10.9 x 5.3 x 7.6 cm. There is a linear echogenic intrauterine device which extends into the lower uterine segment. No fibroids or other mass visualized.  Endometrium  Thickness: Thickened to 21 mm. IUD is within the endometrial canal of the uterine body and lower uterine segment.  Right ovary  Measurements: Seen only transabdominally due to its high lateral location. Ovary measures 6.5 x 3.8 x 6.3 cm. A 4 cm cyst within the ovary.  Left ovary  Measurements: 5.0 x 1.8 x 3 . cm. Hypoechoic cysts within the ovary measuring 4 cm with septation.  Other findings  No free fluid  IMPRESSION: 1. Thickened endometrium 22 mm is  beyond normal limits for premenopausal female. Consider follow-up by [place] in 6-8 weeks, during the week immediately following menses (exam timing is critical). 2. IUD device partially within the lower uterine segment. IUD has migrated compared to the ultrasound exam of 10/ 02/2012 . 3. Cystic lesions within the ovaries. Recommend evaluation at follow-up ultrasound as above.   Electronically Signed   By: Suzy Bouchard M.D.   On: 03/26/2014 18:10   US Pelvis Complete  03/26/2014   CLINICAL DATA:  Vaginal bleeding since March 21, 2014  EXAM: TRANSABDOMINAL AND TRANSVAGINAL ULTRASOUND OF PELVIS  TECHNIQUE: Both transabdominal and transvaginal ultrasound examinations of the pelvis were performed. Transabdominal technique was performed for global imaging of the pelvis including uterus, ovaries, adnexal regions, and pelvic cul-de-sac. It was necessary to proceed with endovaginal exam following the transabdominal exam to visualize the endometrium.  COMPARISON:  Ultrasound 10/09/2012  FINDINGS: Uterus  Measurements: Normal in size at 10.9 x 5.3 x 7.6 cm. There is a linear echogenic intrauterine device which extends into the lower uterine segment. No fibroids or other mass visualized.  Endometrium  Thickness: Thickened to 21 mm. IUD is  within the endometrial canal of the uterine body and lower uterine segment.  Right ovary  Measurements: Seen only transabdominally due to its high lateral location. Ovary measures 6.5 x 3.8 x 6.3 cm. A 4 cm cyst within the ovary.  Left ovary  Measurements: 5.0 x 1.8 x 3 . cm. Hypoechoic cysts within the ovary measuring 4 cm with septation.  Other findings  No free fluid  IMPRESSION: 1. Thickened endometrium 22 mm is beyond normal limits for premenopausal female. Consider follow-up by [place] in 6-8 weeks, during the week immediately following menses (exam timing is critical). 2. IUD device partially within the lower uterine segment. IUD has migrated compared to the ultrasound exam of 10/ 02/2012  . 3. Cystic lesions within the ovaries. Recommend evaluation at follow-up ultrasound as above.   Electronically Signed   By: Suzy Bouchard M.D.   On: 03/26/2014 18:10     EKG Interpretation None      MDM   Final diagnoses:  Dysfunctional uterine bleeding  Endometrial thickening on ultra sound   Nontoxic appearing, NAD. AF VSS. Vaginal bleeding noted on exam. Discomfort on bimanual exam, no acute tenderness. Pelvic ultrasound obtained for further evaluation of the vaginal bleeding. Ultrasound results as stated above. I spoke with Julianne Handler, midwife on-call for patient's OB/GYN Dr. Dellis Filbert, who states patient will need an endometrial biopsy within the next 1-2 weeks. She should call the office Monday to schedule an appointment. She will also need her IUD out. I discussed this with patient and is agreeable. Stable for discharge. Return precautions given. Patient states understanding of treatment care plan and is agreeable.  Discussed with attending Dr. Wyvonnia Dusky who agrees with plan of care.   Carman Ching, PA-C 03/26/14 1909  Ezequiel Essex, MD 03/26/14 [phone]

## 2014-03-29 LAB — GC/CHLAMYDIA PROBE AMP (~~LOC~~) NOT AT ARMC
CHLAMYDIA, DNA PROBE: NEGATIVE
Neisseria Gonorrhea: NEGATIVE

## 2015-03-04 ENCOUNTER — Other Ambulatory Visit: Payer: Self-pay | Admitting: Internal Medicine

## 2015-03-04 DIAGNOSIS — Z1231 Encounter for screening mammogram for malignant neoplasm of breast: Secondary | ICD-10-CM

## 2015-04-29 ENCOUNTER — Other Ambulatory Visit: Payer: Self-pay | Admitting: Internal Medicine

## 2015-04-29 DIAGNOSIS — M79622 Pain in left upper arm: Secondary | ICD-10-CM

## 2015-10-14 ENCOUNTER — Other Ambulatory Visit: Payer: Self-pay | Admitting: Internal Medicine

## 2015-10-14 DIAGNOSIS — Z1231 Encounter for screening mammogram for malignant neoplasm of breast: Secondary | ICD-10-CM

## 2015-10-19 ENCOUNTER — Ambulatory Visit
Admission: RE | Admit: 2015-10-19 | Discharge: 2015-10-19 | Disposition: A | Discharge status: Home / Self Care | Payer: Self-pay | Source: Ambulatory Visit | Attending: Internal Medicine | Admitting: Internal Medicine

## 2015-10-19 DIAGNOSIS — Z1231 Encounter for screening mammogram for malignant neoplasm of breast: Secondary | ICD-10-CM

## 2016-10-30 ENCOUNTER — Other Ambulatory Visit: Payer: Self-pay | Admitting: Family Medicine

## 2016-10-30 DIAGNOSIS — Z1231 Encounter for screening mammogram for malignant neoplasm of breast: Secondary | ICD-10-CM

## 2016-11-16 ENCOUNTER — Ambulatory Visit
Admission: RE | Admit: 2016-11-16 | Discharge: 2016-11-16 | Disposition: A | Discharge status: Home / Self Care | Payer: BLUE CROSS/BLUE SHIELD | Source: Ambulatory Visit | Attending: Family Medicine | Admitting: Family Medicine

## 2016-11-16 DIAGNOSIS — Z1231 Encounter for screening mammogram for malignant neoplasm of breast: Secondary | ICD-10-CM

## 2017-05-03 ENCOUNTER — Telehealth: Payer: Self-pay

## 2017-05-03 NOTE — Telephone Encounter (Signed)
SENT REFERRAL TO SCHEDULING 

## 2017-05-16 ENCOUNTER — Ambulatory Visit (INDEPENDENT_AMBULATORY_CARE_PROVIDER_SITE_OTHER): Payer: BLUE CROSS/BLUE SHIELD | Admitting: Cardiology

## 2017-05-16 ENCOUNTER — Encounter: Payer: Self-pay | Admitting: Cardiology

## 2017-05-16 VITALS — BP 114/79 | HR 82 | Ht 63.5 in | Wt 255.2 lb

## 2017-05-16 DIAGNOSIS — R0602 Shortness of breath: Secondary | ICD-10-CM | POA: Diagnosis not present

## 2017-05-16 DIAGNOSIS — R5382 Chronic fatigue, unspecified: Secondary | ICD-10-CM | POA: Diagnosis not present

## 2017-05-16 DIAGNOSIS — R000 Tachycardia, unspecified: Secondary | ICD-10-CM | POA: Diagnosis not present

## 2017-05-16 DIAGNOSIS — I10 Essential (primary) hypertension: Secondary | ICD-10-CM | POA: Diagnosis not present

## 2017-05-16 DIAGNOSIS — R5381 Other malaise: Secondary | ICD-10-CM | POA: Diagnosis not present

## 2017-05-16 NOTE — Progress Notes (Signed)
PCP: Bartholome Bill, MD  Clinic Note: Chief Complaint  Patient presents with  . New Patient (Initial Visit)    heart palpitations, fatigue and weakness    HPI: Lisa Small is a 47 y.o. female with a history of cardiac disease and the only risk factor would be hypertension.  She is being seen today for the evaluation of heart palpitations, fatigue and weakness at the request of Bartholome Bill, MD.  Lisa Small was last seen on April 22 by her PCP.  Major complaint was palpitations and she was referred for cardiology evaluation.  Recent Hospitalizations: None  Studies Personally Reviewed - (if available, images/films reviewed: From Epic Chart or Care Everywhere)  None  Interval History: Lisa Small presents today mostly complaining of episodes of palpitations that occur off and on usually at night.  She also describes feeling tired all the time.  Interestingly this is improved with taking a walk for personal time.  Apparently she is going through some marital issues and being around her husband is causing some emotional stress. As far as her heart rate is concerned the worst episode that led to her seeing her PCP was mid April where she woke up one night in the night feeling her heart racing/pounding.  She felt swollen and puffy.  She denies any chest pain or pressure associated with this and really only a little bit of shortness of breath.  She has not really noticed any symptoms outside of these episodes of chest tightness pressure or dyspnea at rest or exertion. She does take phentermine For weight loss, but has been on it for a while.  She tells me that she is tired and sleepy during the day, but when she filled out her Epworth score was only 9.  Have asked her to reassess prior to her follow-up visit.  She denies any syncope/near syncope or TIA/amaurosis fugax symptoms.  No claudication.  ROS: A comprehensive was performed. Review of Systems    Constitutional: Positive for malaise/fatigue. Negative for chills, diaphoresis, fever and weight loss (She feels as though she is putting on weight).  HENT: Negative for congestion and nosebleeds.   Cardiovascular: Positive for palpitations.  Gastrointestinal: Negative for blood in stool, heartburn and melena.  Genitourinary: Negative for hematuria.  Musculoskeletal: Negative for joint pain.  Psychiatric/Behavioral: Positive for depression. The patient is nervous/anxious and has insomnia (Is alert and going to sleep, but then feels tired during the day).        Social stress involved with " marital problems"   I have reviewed and (if needed) personally updated the patient's problem list, medications, allergies, past medical and surgical history, social and family history.   Past Medical History:  Diagnosis Date  . Arthritis   . Hypertension     Past Surgical History:  Procedure Laterality Date  . CESAREAN SECTION    . KNEE SURGERY Right 2004   Secondary to trauma    Current Meds  Medication Sig  . acyclovir (ZOVIRAX) 800 MG tablet Take 800 mg by mouth daily.  Marland Kitchen amLODipine (NORVASC) 10 MG tablet Take 10 mg by mouth daily.  . hydrochlorothiazide (HYDRODIURIL) 25 MG tablet Take 25 mg by mouth daily.  Marland Kitchen KLOR-CON M10 10 MEQ tablet Take 10 mEq by mouth daily.  . meloxicam (MOBIC) 15 MG tablet Take 1 tablet (15 mg total) by mouth daily. (Patient taking differently: Take 15 mg by mouth daily as needed for pain. )    No Known Allergies  Social  History   Tobacco Use  . Smoking status: Never Smoker  . Smokeless tobacco: Never Used  Substance Use Topics  . Alcohol use: No  . Drug use: No   Social History   Social History Narrative   Married mother of 1 son.  She has a high school education and works as a Education officer, community.   She just started walking building up to 25-30 minutes a day trying to do 3 days a week.    family history includes CVA (age of onset: 28) in her mother;  Diabetes in her sister; Heart disease in her maternal grandfather and paternal grandmother; Heart disease (age of onset: 37) in her mother; Pneumonia (age of onset: 47) in her mother.  Wt Readings from Last 3 Encounters:  05/16/17 255 lb 3.2 oz (115.8 kg)  01/28/13 253 lb (114.8 kg)  10/11/12 245 lb (111.1 kg)    PHYSICAL EXAM BP 114/79 (BP Location: Right Arm)   Pulse 82   Ht 5' 3.5" (1.613 m)   Wt 255 lb 3.2 oz (115.8 kg)   BMI 44.50 kg/m  Physical Exam  Constitutional: She is oriented to person, place, and time.  Morbidly obese woman.  Somewhat subdued, but in no acute distress.  Well-groomed.  HENT:  Head: Normocephalic and atraumatic.  Mouth/Throat: Oropharynx is clear and moist. No oropharyngeal exudate.  Eyes: Pupils are equal, round, and reactive to light. Conjunctivae and EOM are normal. No scleral icterus.  Neck: Normal range of motion. Neck supple. No hepatojugular reflux and no JVD present. Carotid bruit is not present. No thyromegaly present.  Cardiovascular: Normal rate, regular rhythm, normal heart sounds and normal pulses.  No extrasystoles are present. PMI is not displaced (Unable to palpate due to body habitus). Exam reveals no gallop, no friction rub and no decreased pulses.  No murmur heard. Pulmonary/Chest: Effort normal and breath sounds normal. No respiratory distress. She has no wheezes. She has no rales.  Abdominal: Soft. Bowel sounds are normal. She exhibits no distension. There is no tenderness. There is no rebound.  Musculoskeletal: Normal range of motion. She exhibits no edema.  Neurological: She is alert and oriented to person, place, and time. No cranial nerve deficit.  Skin: Skin is warm and dry. No erythema.  Psychiatric: She has a normal mood and affect. Her behavior is normal. Judgment and thought content normal.     Adult ECG Report  Rate: 82 ;  Rhythm: normal sinus rhythm and Normal axis, intervals and durations;   Narrative Interpretation:  Normal EKG   Other studies Reviewed: Additional studies/ records that were reviewed today include:  Recent Labs: PCP ordered iron profile, CMP, CBC TSH and ferritin level.  Labs not available   ASSESSMENT / PLAN: Problem List Items Addressed This Visit    Shortness of breath    Short of breath really only when her heart rate is going fast.  She would walk fast.Not expected, but she also probably has exertional dyspnea.   checking a 2D echocardiogram to assess for structural normality.      Relevant Orders   CARDIAC EVENT MONITOR   ECHOCARDIOGRAM COMPLETE   Rapid heartbeat - Primary    Difficult to tell with his rapid heartbeat spells are.  Cannot exclude short runs of PAT or PSVT.  Need to assess with 30-day event monitor because it is not happening frequently enough to capture the 48-hour monitor. I will also check a 2D echocardiogram his clinical exam appears very difficult due to body  habitus.  Need to exclude structural normality.  I discussed vagal maneuvers.  Recommended aggressive hydration and reducing caffeine intake.      Relevant Orders   EKG 12-Lead (Completed)   CARDIAC EVENT MONITOR   ECHOCARDIOGRAM COMPLETE   Essential hypertension, benign (Chronic)    Well-controlled blood pressure today is on amlodipine and HCTZ.  Tolerating well.      Chronic fatigue and malaise    Hard to really tell what this is related to.  It may just be stress related.  Could also be related to OSA.  Plan to reassess Epworth scale in follow-up.  She also has some insomnia and may need to education on sleep hygiene.  Recommend continued exercise and dietary modification and weight loss.         I spent a total of 35 minutes with the patient and chart review. >  50% of the time was spent in direct patient consultation.  Discussed vagal maneuvers  Current medicines are reviewed at length with the patient today.  (+/- concerns) n/a The following changes have been made:  n/a  Patient  Instructions  MEDICATION INSTRUCTION NO CHANGES  Recommendations for vagal maneuvers:  "Bearing down"  Coughing  Gagging  Icy, cold towel on face or drink ice cold water    KEEP HYDRATED ,  DECREASE USING  CAFFEINE INTAKE    TEST SCHEDULE AT [place] [facility]  Your physician has requested that you have an echocardiogram. Echocardiography is a painless test that uses sound waves to create images of your heart. It provides your doctor with information about the size and shape of your heart and how well your heart's chambers and valves are working. This procedure takes approximately one hour. There are no restrictions for this procedure.   AND  Your physician has recommended that you wear an event monitor- 30 DAYS. Event monitors are medical devices that record the heart's electrical activity. Doctors most often [place] these monitors to diagnose arrhythmias. Arrhythmias are problems with the speed or rhythm of the heartbeat. The monitor is a small, portable device. You can wear one while you do your normal daily activities. This is usually used to diagnose what is causing palpitations/syncope (passing out).      Your physician recommends that you schedule a follow-up appointment in 2 [place].   Studies Ordered:   Orders Placed This Encounter  Procedures  . CARDIAC EVENT MONITOR  . EKG 12-Lead  . ECHOCARDIOGRAM COMPLETE      Glenetta Hew, M.D., M.S. Interventional Cardiologist   Pager # [phone] Phone # [phone] [address] [place], [place] [address]   Thank you for choosing [facility] at [facility]!!

## 2017-05-16 NOTE — Patient Instructions (Signed)
MEDICATION INSTRUCTION NO CHANGES  Recommendations for vagal maneuvers:  "Bearing down"  Coughing  Gagging  Icy, cold towel on face or drink ice cold water    KEEP HYDRATED ,  DECREASE USING  CAFFEINE INTAKE    TEST SCHEDULE AT [place] [address]  Your physician has requested that you have an echocardiogram. Echocardiography is a painless test that uses sound waves to create images of your heart. It provides your doctor with information about the size and shape of your heart and how well your heart's chambers and valves are working. This procedure takes approximately one hour. There are no restrictions for this procedure.   AND  Your physician has recommended that you wear an event monitor- 30 DAYS. Event monitors are medical devices that record the heart's electrical activity. Doctors most often [place] these monitors to diagnose arrhythmias. Arrhythmias are problems with the speed or rhythm of the heartbeat. The monitor is a small, portable device. You can wear one while you do your normal daily activities. This is usually used to diagnose what is causing palpitations/syncope (passing out).      Your physician recommends that you schedule a follow-up appointment in 2 [place].

## 2017-05-20 ENCOUNTER — Encounter: Payer: Self-pay | Admitting: Cardiology

## 2017-05-20 DIAGNOSIS — R5381 Other malaise: Secondary | ICD-10-CM | POA: Insufficient documentation

## 2017-05-20 DIAGNOSIS — R5382 Chronic fatigue, unspecified: Secondary | ICD-10-CM

## 2017-05-20 NOTE — Assessment & Plan Note (Signed)
Hard to really tell what this is related to.  It may just be stress related.  Could also be related to OSA.  Plan to reassess Epworth scale in follow-up.  She also has some insomnia and may need to education on sleep hygiene.  Recommend continued exercise and dietary modification and weight loss.

## 2017-05-20 NOTE — Assessment & Plan Note (Signed)
Short of breath really only when her heart rate is going fast.  She would walk fast.Not expected, but she also probably has exertional dyspnea.   checking a 2D echocardiogram to assess for structural normality.

## 2017-05-20 NOTE — Assessment & Plan Note (Signed)
Well-controlled blood pressure today is on amlodipine and HCTZ.  Tolerating well.

## 2017-05-20 NOTE — Assessment & Plan Note (Signed)
Difficult to tell with his rapid heartbeat spells are.  Cannot exclude short runs of PAT or PSVT.  Need to assess with 30-day event monitor because it is not happening frequently enough to capture the 48-hour monitor. I will also check a 2D echocardiogram his clinical exam appears very difficult due to body habitus.  Need to exclude structural normality.  I discussed vagal maneuvers.  Recommended aggressive hydration and reducing caffeine intake.

## 2017-05-29 ENCOUNTER — Other Ambulatory Visit: Payer: Self-pay | Admitting: Cardiology

## 2017-05-29 ENCOUNTER — Ambulatory Visit (INDEPENDENT_AMBULATORY_CARE_PROVIDER_SITE_OTHER): Payer: BLUE CROSS/BLUE SHIELD

## 2017-05-29 ENCOUNTER — Ambulatory Visit (HOSPITAL_COMMUNITY): Payer: BLUE CROSS/BLUE SHIELD | Attending: Cardiology

## 2017-05-29 ENCOUNTER — Other Ambulatory Visit: Payer: Self-pay

## 2017-05-29 DIAGNOSIS — R0600 Dyspnea, unspecified: Secondary | ICD-10-CM | POA: Insufficient documentation

## 2017-05-29 DIAGNOSIS — R000 Tachycardia, unspecified: Secondary | ICD-10-CM

## 2017-05-29 DIAGNOSIS — I10 Essential (primary) hypertension: Secondary | ICD-10-CM | POA: Diagnosis not present

## 2017-05-29 DIAGNOSIS — R0602 Shortness of breath: Secondary | ICD-10-CM | POA: Diagnosis present

## 2017-05-29 DIAGNOSIS — R002 Palpitations: Secondary | ICD-10-CM | POA: Diagnosis not present

## 2017-05-30 ENCOUNTER — Telehealth: Payer: Self-pay | Admitting: *Deleted

## 2017-05-30 NOTE — Progress Notes (Signed)
Echo results: Good news: Essentially normal echocardiogram and normal pump function and normal valve function.  No signs to suggest heart attack.. EF: 60-65%.No regional wall motion abnormalities.  Glenetta Hew, MD  pls fwd to PCP: Bartholome Bill, MD

## 2017-05-30 NOTE — Telephone Encounter (Signed)
LEFT MESSAGE TO CALL BACK IN REGARDS TO TEST RESULTS

## 2017-05-30 NOTE — Telephone Encounter (Signed)
New Message: ° ° ° ° ° °Pt is returning a call for echo results °

## 2017-05-30 NOTE — Telephone Encounter (Signed)
Spoke to patient. Result given . Verbalized understanding  

## 2017-05-30 NOTE — Telephone Encounter (Signed)
-----   Message from Leonie Man, MD sent at 05/30/2017 12:56 PM EDT ----- Echo results: Good news: Essentially normal echocardiogram and normal pump function and normal valve function.  No signs to suggest heart attack.. EF: 60-65%.No regional wall motion abnormalities.  Glenetta Hew, MD  pls fwd to PCP: Bartholome Bill, MD

## 2017-07-19 ENCOUNTER — Ambulatory Visit (INDEPENDENT_AMBULATORY_CARE_PROVIDER_SITE_OTHER): Payer: BLUE CROSS/BLUE SHIELD | Admitting: Cardiology

## 2017-07-19 DIAGNOSIS — I10 Essential (primary) hypertension: Secondary | ICD-10-CM | POA: Diagnosis not present

## 2017-07-19 DIAGNOSIS — R000 Tachycardia, unspecified: Secondary | ICD-10-CM

## 2017-07-19 NOTE — Progress Notes (Signed)
PCP: Bartholome Bill, MD  Clinic Note: Chief Complaint  Patient presents with  . Follow-up    f/u Echo  . Palpitations    Notably improved    HPI: Lisa Small is a 47 y.o. female with a history of cardiac disease and the only risk factor would be hypertension.  She is being seen today for follow-up evaluation of heart palpitations, fatigue and weakness at the request of Bartholome Bill, MD.  Lisa Small was seen on April 22 by her PCP.  Major complaint was palpitations and she => evaluation w/ Echo & event monitor   Recent Hospitalizations: None  Studies Personally Reviewed - (if available, images/films reviewed: From Epic Chart or Care Everywhere)  Event Monitor: normal - NSR & S Tachy.  No arrhythmia.  Minmal PAC & PVC -- nothing to explain Sx of flutter & CP. (12 patient triggered evens - all NSR).  Avg HR 82 bpm  2D Echo: Normal EF 60 of 65%.  No RWMA.  Normal valve function  Interval History: Lisa Small presents today stating that she really has not had any more palpitations since I last saw her.  It seems like once the stress at home resolved, her palpitations got better.  She only notes that when her job is stressful, but it is improved dramatically.  She denies any rapid irregular heartbeats.  No syncope or near syncope.  No TIA or amaurosis fugax.  No longer feeling her heart pounding with any more near the frequency and it had before..  Cardiovascular review of symptoms: no chest pain or dyspnea on exertion positive for - irregular heartbeat and But no anemia is much negative for - dyspnea on exertion, edema, loss of consciousness, orthopnea, paroxysmal nocturnal dyspnea, rapid heart rate or shortness of breath  No claudication.  ROS: A comprehensive was performed. Review of Systems  Constitutional: Positive for malaise/fatigue. Negative for chills, diaphoresis, fever and weight loss (She feels as though she is putting on weight).  HENT:  Negative for congestion and nosebleeds.   Cardiovascular: Negative for palpitations (Notably improved).  Gastrointestinal: Negative for blood in stool, heartburn and melena.  Genitourinary: Negative for hematuria.  Musculoskeletal: Negative for joint pain.  Neurological: Negative for dizziness and weakness.  Psychiatric/Behavioral: Positive for depression. The patient is nervous/anxious and has insomnia ( feels tired during the day).        Improved social stress   I have reviewed and (if needed) personally updated the patient's problem list, medications, allergies, past medical and surgical history, social and family history.   Past Medical History:  Diagnosis Date  . Arthritis   . Hypertension     Past Surgical History:  Procedure Laterality Date  . CESAREAN SECTION    . KNEE SURGERY Right 2004   Secondary to trauma    Current Meds  Medication Sig  . acyclovir (ZOVIRAX) 800 MG tablet Take 800 mg by mouth daily.  Marland Kitchen amLODipine (NORVASC) 10 MG tablet Take 10 mg by mouth daily.  . hydrochlorothiazide (HYDRODIURIL) 25 MG tablet Take 25 mg by mouth daily.  . meloxicam (MOBIC) 15 MG tablet Take 1 tablet (15 mg total) by mouth daily. (Patient taking differently: Take 15 mg by mouth daily as needed for pain. )  . phentermine 37.5 MG capsule Take 37.5 mg by mouth every morning.    No Known Allergies  Social History   Tobacco Use  . Smoking status: Never Smoker  . Smokeless tobacco: Never Used  Substance Use Topics  .  Alcohol use: No  . Drug use: No   Social History   Social History Narrative   Married mother of 1 son.  She has a high school education and works as a Education officer, community.   She just started walking building up to 25-30 minutes a day trying to do 3 days a week.    family history includes CVA (age of onset: 22) in her mother; Diabetes in her sister; Heart disease in her maternal grandfather and paternal grandmother; Heart disease (age of onset: 16) in her mother;  Pneumonia (age of onset: 60) in her mother.  Wt Readings from Last 3 Encounters:  07/19/17 259 lb 6.4 oz (117.7 kg)  05/16/17 255 lb 3.2 oz (115.8 kg)  01/28/13 253 lb (114.8 kg)    PHYSICAL EXAM BP 128/87   Pulse 89   Ht 5' 3.5" (1.613 m)   Wt 259 lb 6.4 oz (117.7 kg)   BMI 45.23 kg/m  Physical Exam  Constitutional: She is oriented to person, place, and time. She appears well-developed and well-nourished. No distress.  Morbidly obese woman.   Well-groomed.  HENT:  Head: Normocephalic and atraumatic.  Neck: No hepatojugular reflux present. Carotid bruit is not present.  Cardiovascular: Normal rate, regular rhythm, normal heart sounds and normal pulses.  No extrasystoles are present. PMI is not displaced (Unable to palpate due to body habitus). Exam reveals no gallop and no friction rub.  No murmur heard. Pulmonary/Chest: Effort normal and breath sounds normal. No respiratory distress. She has no wheezes. She has no rales.  Musculoskeletal: Normal range of motion.  Neurological: She is alert and oriented to person, place, and time.  Psychiatric: She has a normal mood and affect. Her behavior is normal. Judgment and thought content normal.     Adult ECG Report  Rate: 82 ;  Rhythm: normal sinus rhythm and Normal axis, intervals and durations;   Narrative Interpretation: Normal EKG   Other studies Reviewed: Additional studies/ records that were reviewed today include:  Recent Labs: PCP ordered iron profile, CMP, CBC TSH and ferritin level.  Labs not available   ASSESSMENT / PLAN: Problem List Items Addressed This Visit    Rapid heartbeat    Probably just having some brief episodes of sinus tachycardia.  Nothing to show any signs of PAT or PSVT on monitor.  Normal echocardiogram with no structural of normalities.  Discussed adequate hydration and vagal maneuvers again.  I do think this is probably related to stress. Avoid caffeine and excess sugar.  Follow-up as needed.        Essential hypertension, benign (Chronic)    Blood pressure is well controlled.           Current medicines are reviewed at length with the patient today.  (+/- concerns) n/a The following changes have been made:  n/a  Patient Instructions  No medication changes   If the nagging chest twinges return try  Tylenol or Motrin ( may use generic)    Your physician recommends that you schedule a follow-up appointment on as needed basis.   Studies Ordered:   No orders of the defined types were placed in this encounter.     Glenetta Hew, M.D., M.S. Interventional Cardiologist   Pager # [phone] Phone # [phone] [address] [place], [place] [address]   Thank you for choosing [facility] at [facility]!!

## 2017-07-19 NOTE — Patient Instructions (Signed)
No medication changes   If the nagging chest twinges return try  Tylenol or Motrin ( may use generic)    Your physician recommends that you schedule a follow-up appointment on as needed basis.

## 2017-07-20 ENCOUNTER — Encounter: Payer: Self-pay | Admitting: Cardiology

## 2017-07-20 NOTE — Assessment & Plan Note (Signed)
Blood pressure is well controlled 

## 2017-07-20 NOTE — Assessment & Plan Note (Signed)
Probably just having some brief episodes of sinus tachycardia.  Nothing to show any signs of PAT or PSVT on monitor.  Normal echocardiogram with no structural of normalities.  Discussed adequate hydration and vagal maneuvers again.  I do think this is probably related to stress. Avoid caffeine and excess sugar.  Follow-up as needed.

## 2017-11-13 ENCOUNTER — Other Ambulatory Visit: Payer: Self-pay | Admitting: Family Medicine

## 2017-11-13 DIAGNOSIS — Z1231 Encounter for screening mammogram for malignant neoplasm of breast: Secondary | ICD-10-CM

## 2017-12-27 ENCOUNTER — Ambulatory Visit
Admission: RE | Admit: 2017-12-27 | Discharge: 2017-12-27 | Disposition: A | Discharge status: Home / Self Care | Payer: BC Managed Care – PPO | Source: Ambulatory Visit | Attending: Family Medicine | Admitting: Family Medicine

## 2017-12-27 DIAGNOSIS — Z1231 Encounter for screening mammogram for malignant neoplasm of breast: Secondary | ICD-10-CM

## 2020-05-19 IMAGING — MG DIGITAL SCREENING BILATERAL MAMMOGRAM WITH TOMO AND CAD
8 series · 8 of 24 positions shown · non-contrast
Comparison: Previous exam(s).

CLINICAL DATA: Screening.

EXAM:
DIGITAL SCREENING BILATERAL MAMMOGRAM WITH TOMO AND CAD

[R CC synth-2D]
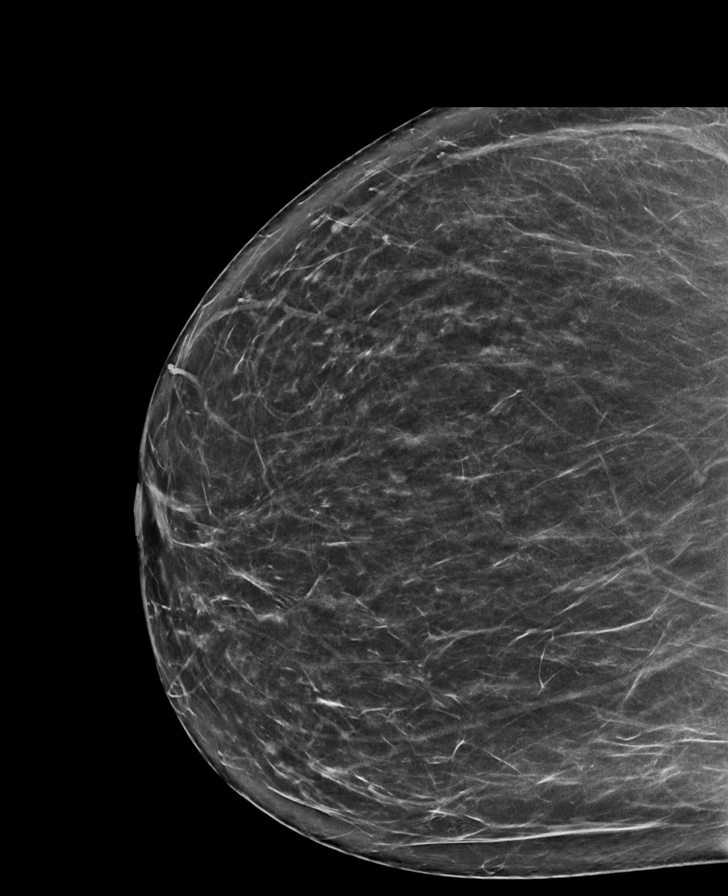

[L MLO synth-2D]
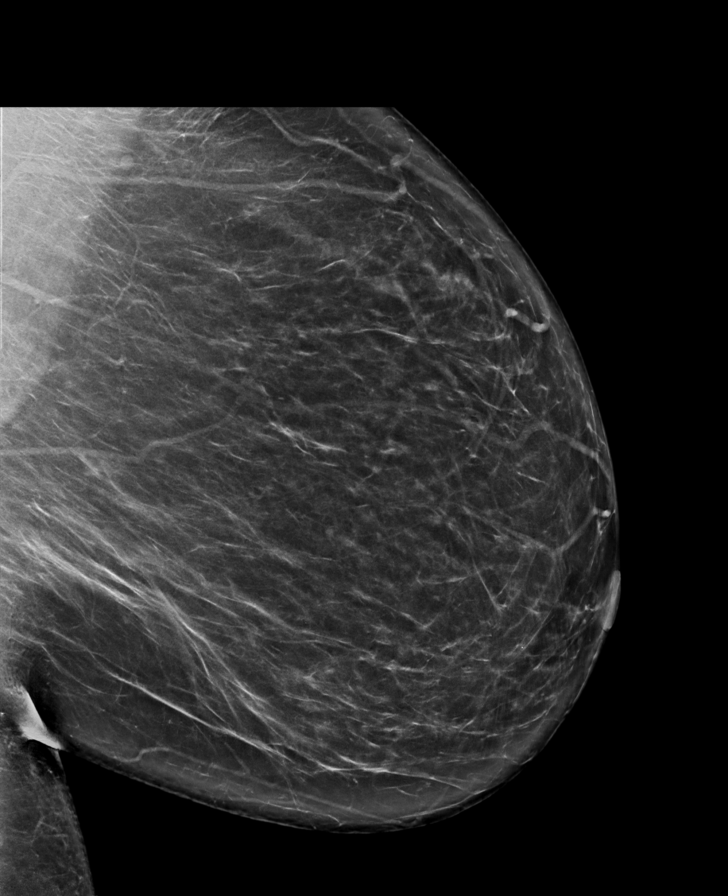

[L CC synth-2D]
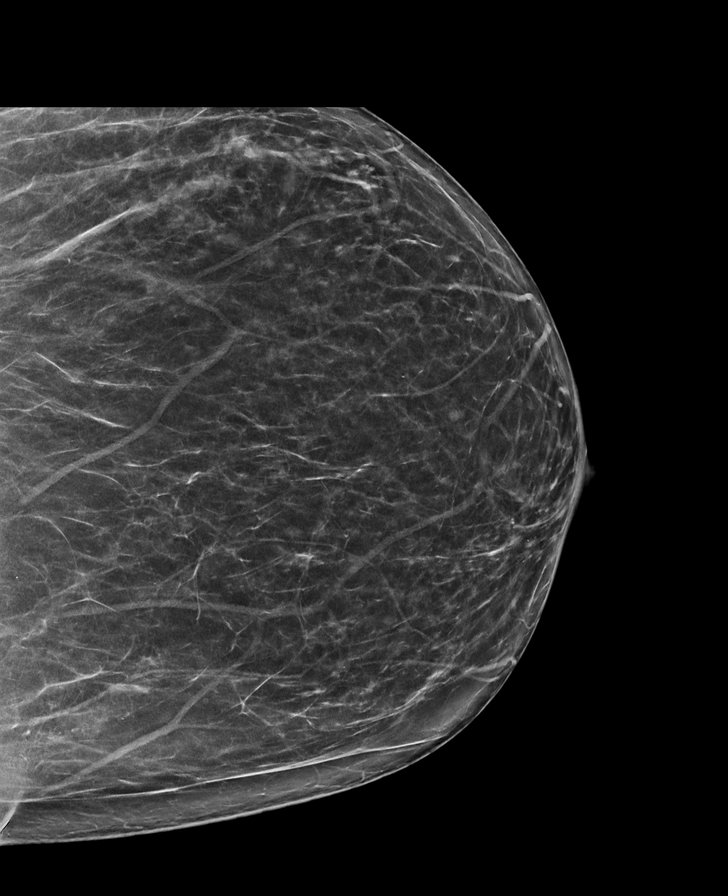

[R MLO synth-2D]
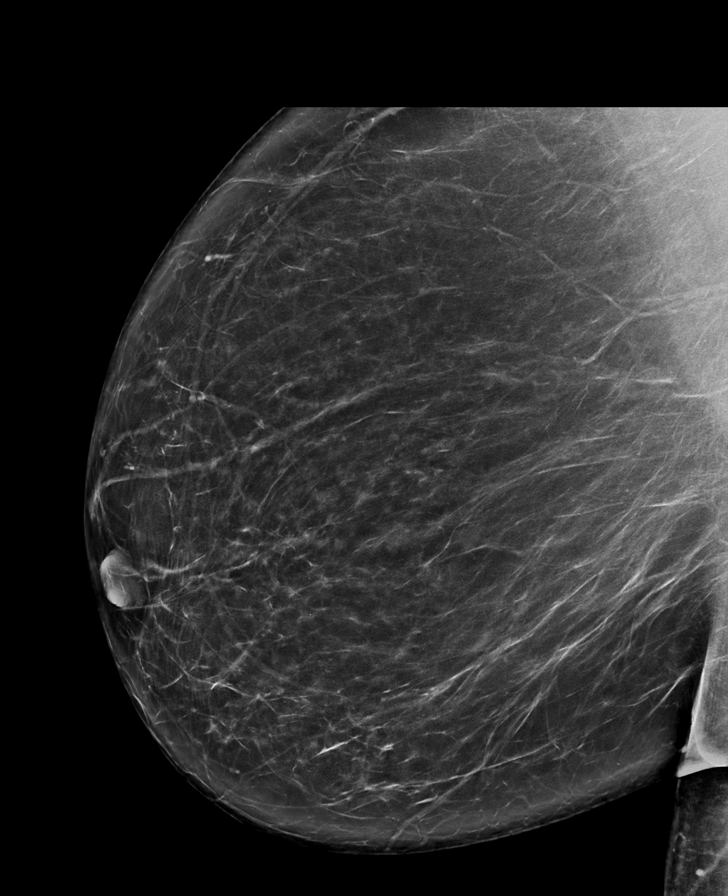

[R CC tomo · tomo slice 42/83.0]
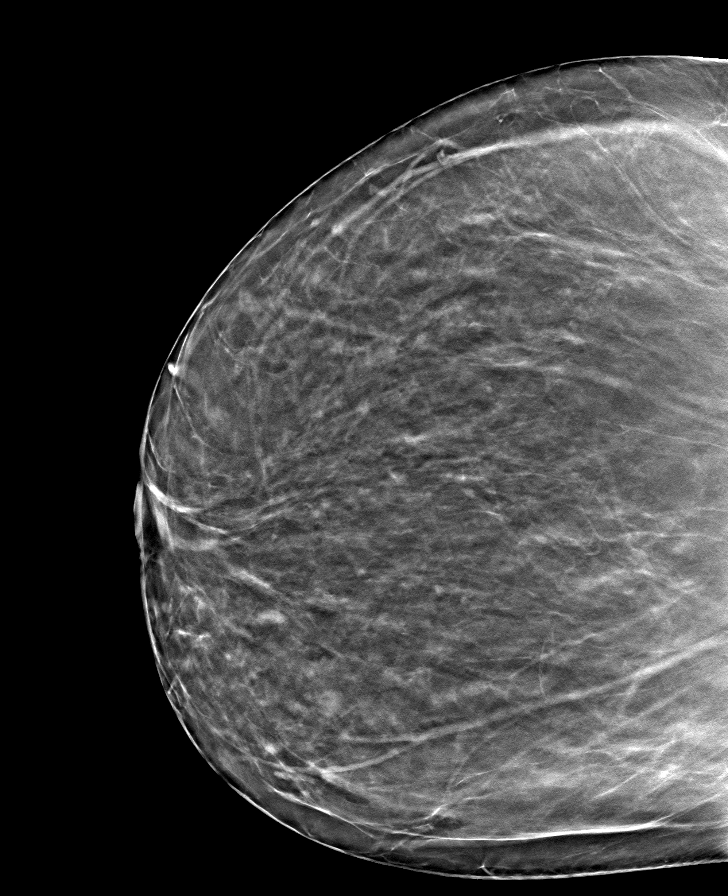

[L MLO tomo · tomo slice 47/94.0]
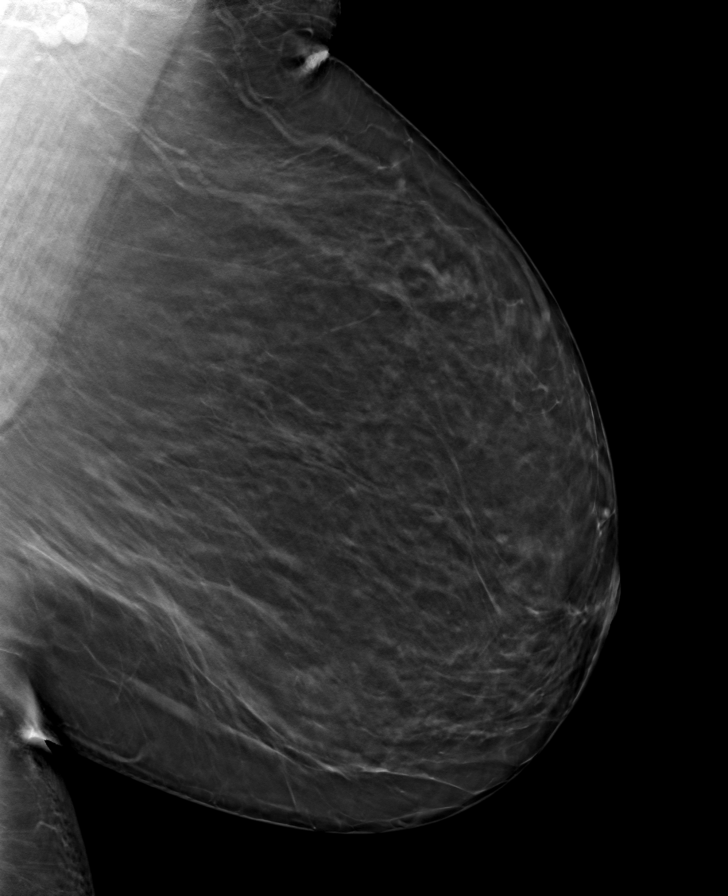

[L CC tomo · tomo slice 39/78.0]
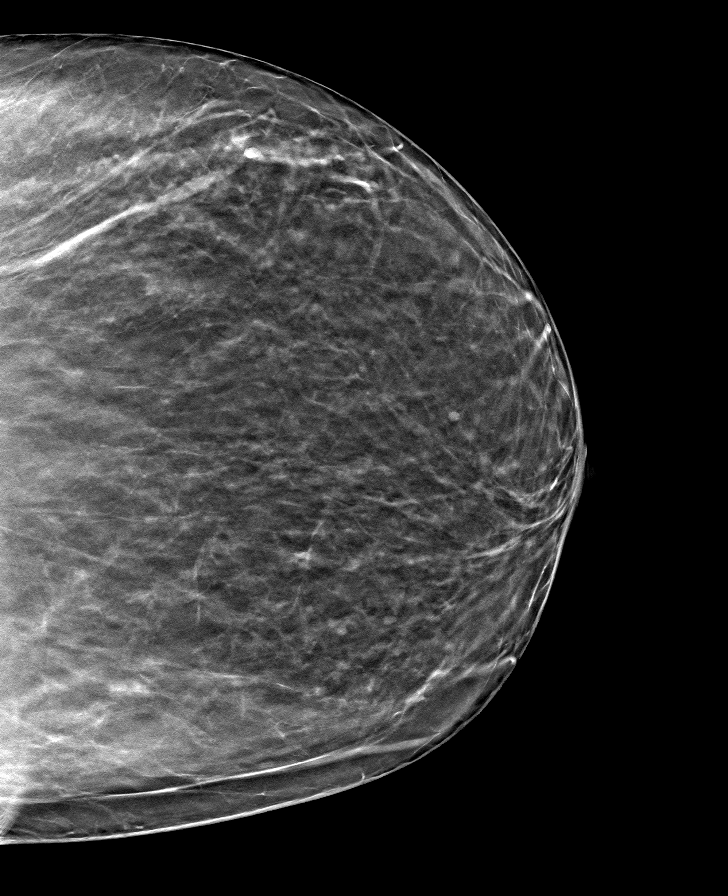

[R MLO tomo · tomo slice 48/95.0]
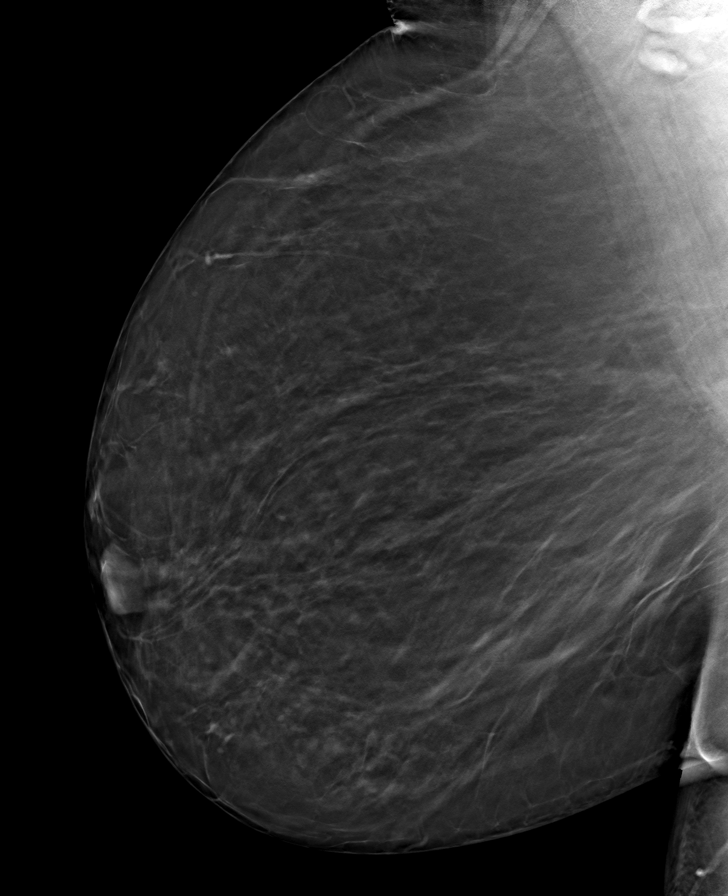

[8 of 24 positions shown; findings below may reference images not displayed]

ACR Breast Density Category b: There are scattered areas of
fibroglandular density.
FINDINGS: There are no findings suspicious for malignancy. Images were
processed with CAD.
IMPRESSION: No mammographic evidence of malignancy. A result letter of this
screening mammogram will be mailed directly to the patient.

RECOMMENDATION:
Screening mammogram in one year. (Code:[id number])

BI-RADS CATEGORY  1: Negative.
